# Patient Record
Sex: Male | Born: 1955 | Race: Black or African American | Hispanic: No | Marital: Married | Smoking: Never smoker
Health system: Southern US, Community
[De-identification: ages and names within clinical notes are randomized; demographics above are authoritative.]

## PROBLEM LIST (undated history)

## (undated) DIAGNOSIS — I82409 Acute embolism and thrombosis of unspecified deep veins of unspecified lower extremity: Secondary | ICD-10-CM

## (undated) DIAGNOSIS — F32A Depression, unspecified: Secondary | ICD-10-CM

## (undated) DIAGNOSIS — K219 Gastro-esophageal reflux disease without esophagitis: Secondary | ICD-10-CM

## (undated) DIAGNOSIS — R519 Headache, unspecified: Secondary | ICD-10-CM

## (undated) DIAGNOSIS — I10 Essential (primary) hypertension: Secondary | ICD-10-CM

## (undated) DIAGNOSIS — F419 Anxiety disorder, unspecified: Secondary | ICD-10-CM

## (undated) DIAGNOSIS — G4730 Sleep apnea, unspecified: Secondary | ICD-10-CM

## (undated) DIAGNOSIS — M1990 Unspecified osteoarthritis, unspecified site: Secondary | ICD-10-CM

## (undated) DIAGNOSIS — E785 Hyperlipidemia, unspecified: Secondary | ICD-10-CM

## (undated) DIAGNOSIS — N529 Male erectile dysfunction, unspecified: Secondary | ICD-10-CM

## (undated) HISTORY — PX: COLONOSCOPY: SHX174

## (undated) HISTORY — PX: TONSILLECTOMY: SUR1361

## (undated) HISTORY — PX: FRACTURE SURGERY: SHX138

## (undated) HISTORY — PX: BACK SURGERY: SHX140

---

## 2021-04-03 DIAGNOSIS — B029 Zoster without complications: Secondary | ICD-10-CM

## 2021-04-03 HISTORY — DX: Zoster without complications: B02.9

## 2021-04-05 ENCOUNTER — Other Ambulatory Visit: Payer: Self-pay | Admitting: Neurosurgery

## 2021-04-05 DIAGNOSIS — Z01818 Encounter for other preprocedural examination: Secondary | ICD-10-CM

## 2021-04-24 ENCOUNTER — Encounter
Admission: RE | Admit: 2021-04-24 | Discharge: 2021-04-24 | Disposition: A | Discharge status: Home / Self Care | Payer: Medicare Other | Source: Ambulatory Visit | Attending: Neurosurgery | Admitting: Neurosurgery

## 2021-04-24 ENCOUNTER — Other Ambulatory Visit: Payer: Self-pay

## 2021-04-24 DIAGNOSIS — Z01818 Encounter for other preprocedural examination: Secondary | ICD-10-CM | POA: Insufficient documentation

## 2021-04-24 DIAGNOSIS — I10 Essential (primary) hypertension: Secondary | ICD-10-CM | POA: Insufficient documentation

## 2021-04-24 HISTORY — DX: Anxiety disorder, unspecified: F41.9

## 2021-04-24 HISTORY — DX: Depression, unspecified: F32.A

## 2021-04-24 HISTORY — DX: Unspecified osteoarthritis, unspecified site: M19.90

## 2021-04-24 HISTORY — DX: Hyperlipidemia, unspecified: E78.5

## 2021-04-24 HISTORY — DX: Essential (primary) hypertension: I10

## 2021-04-24 HISTORY — DX: Male erectile dysfunction, unspecified: N52.9

## 2021-04-24 HISTORY — DX: Sleep apnea, unspecified: G47.30

## 2021-04-24 HISTORY — DX: Headache, unspecified: R51.9

## 2021-04-24 HISTORY — DX: Acute embolism and thrombosis of unspecified deep veins of unspecified lower extremity: I82.409

## 2021-04-24 HISTORY — DX: Gastro-esophageal reflux disease without esophagitis: K21.9

## 2021-04-24 LAB — URINALYSIS, ROUTINE W REFLEX MICROSCOPIC
Bilirubin Urine: NEGATIVE
Glucose, UA: NEGATIVE mg/dL
Hgb urine dipstick: NEGATIVE
Ketones, ur: NEGATIVE mg/dL
Leukocytes,Ua: NEGATIVE
Nitrite: NEGATIVE
Protein, ur: NEGATIVE mg/dL
Specific Gravity, Urine: 1.015 (ref 1.005–1.030)
pH: 8.5 — ABNORMAL HIGH (ref 5.0–8.0)

## 2021-04-24 LAB — BASIC METABOLIC PANEL
Anion gap: 10 (ref 5–15)
BUN: 14 mg/dL (ref 8–23)
CO2: 30 mmol/L (ref 22–32)
Calcium: 9.5 mg/dL (ref 8.9–10.3)
Chloride: 99 mmol/L (ref 98–111)
Creatinine, Ser: 0.92 mg/dL (ref 0.61–1.24)
GFR, Estimated: >60 mL/min (ref >60)
Glucose, Bld: 95 mg/dL (ref 70–99)
Potassium: 3.5 mmol/L (ref 3.5–5.1)
Sodium: 139 mmol/L (ref 135–145)

## 2021-04-24 LAB — CBC
HCT: 41.8 % (ref 39.0–52.0)
Hemoglobin: 13.2 g/dL (ref 13.0–17.0)
MCH: 27.6 pg (ref 26.0–34.0)
MCHC: 31.6 g/dL (ref 30.0–36.0)
MCV: 87.4 fL (ref 80.0–100.0)
Platelets: 285 10*3/uL (ref 150–400)
RBC: 4.78 MIL/uL (ref 4.22–5.81)
RDW: 13.8 % (ref 11.5–15.5)
WBC: 7.2 10*3/uL (ref 4.0–10.5)
nRBC: 0 % (ref 0.0–0.2)

## 2021-04-24 LAB — SURGICAL PCR SCREEN
MRSA, PCR: NEGATIVE
Staphylococcus aureus: NEGATIVE

## 2021-04-24 LAB — PROTIME-INR
INR: 1.1 (ref 0.8–1.2)
Prothrombin Time: 13.9 seconds (ref 11.4–15.2)

## 2021-04-24 LAB — TYPE AND SCREEN
ABO/RH(D): O POS
Antibody Screen: NEGATIVE

## 2021-04-24 LAB — APTT: aPTT: 28 seconds (ref 24–36)

## 2021-04-24 NOTE — Patient Instructions (Addendum)
Your procedure is scheduled on:05-08-21 Monday Report to the Registration Desk on the 1st floor of the [facility].Then proceed to the 2nd floor Surgery Desk in the [facility] To find out your arrival time, please call [phone] between 1PM - 3PM on:05-05-21 Friday  REMEMBER: Instructions that are not followed completely may result in serious medical risk, up to and including death; or upon the discretion of your surgeon and anesthesiologist your surgery may need to be rescheduled.  Do not eat food after midnight the night before surgery.  No gum chewing, lozengers or hard candies.  You may however, drink CLEAR liquids up to 2 hours before you are scheduled to arrive for your surgery. Do not drink anything within 2 hours of your scheduled arrival time.  Clear liquids include: - water  - apple juice without pulp - gatorade (not RED, PURPLE, OR BLUE) - black coffee or tea (Do NOT add milk or creamers to the coffee or tea) Do NOT drink anything that is not on this list  TAKE THESE MEDICATIONS THE MORNING OF SURGERY WITH A SIP OF WATER: -citalopram (CELEXA)  -gabapentin (NEURONTIN)  -methocarbamol (ROBAXIN)  -pantoprazole (PROTONIX )  Stop your apixaban (ELIQUIS) 3 days prior to surgery as instructed by Dr Julianne Handler dose on 05-04-21 Thursday Continue your aspirin EC 81 MG up until the day prior to surgery-Do NOT take the day of surgery  One week prior to surgery: Stop Anti-inflammatories (NSAIDS) such as Advil, Aleve, Ibuprofen, Motrin, Naproxen, Naprosyn and Aspirin based products such as Excedrin, Goodys Powder, BC Powder.You may however, continue to take Tylenol/Tramadol/Oxycodone if needed for pain up until the day of surgery.  Stop ANY OVER THE COUNTER supplements/vitamins 7 days prior to surgery (Echinacea, Multiple Vitamins, Omega-3 Fatty Acids (FISH OIL)-You may continue your Melatonin up until the day prior to surgery  No Alcohol for 24 hours before or after  surgery.  No Smoking including e-cigarettes for 24 hours prior to surgery.  No chewable tobacco products for at least 6 hours prior to surgery.  No nicotine patches on the day of surgery.  Do not use any "recreational" drugs for at least a week prior to your surgery.  Please be advised that the combination of cocaine and anesthesia may have negative outcomes, up to and including death. If you test positive for cocaine, your surgery will be cancelled.  On the morning of surgery brush your teeth with toothpaste and water, you may rinse your mouth with mouthwash if you wish. Do not swallow any toothpaste or mouthwash.  Use CHG Soap as directed on instruction sheet.  Bring your C-pap machine to the hospital  Do not wear jewelry, make-up, hairpins, clips or nail polish.  Do not wear lotions, powders, or perfumes.   Do not shave body from the neck down 48 hours prior to surgery just in case you cut yourself which could leave a site for infection.  Also, freshly shaved skin may become irritated if using the CHG soap.  Contact lenses, hearing aids and dentures may not be worn into surgery.  Do not bring valuables to the hospital. [facility] is not responsible for any missing/lost belongings or valuables.   Notify your doctor if there is any change in your medical condition (cold, fever, infection).  Wear comfortable clothing (specific to your surgery type) to the hospital.  After surgery, you can help prevent lung complications by doing breathing exercises.  Take deep breaths and cough every 1-2 hours. Your doctor may order  a device called an Incentive Spirometer to help you take deep breaths. When coughing or sneezing, hold a pillow firmly against your incision with both hands. This is called splinting. Doing this helps protect your incision. It also decreases belly discomfort.  If you are being admitted to the hospital overnight, leave your suitcase in the car. After surgery it may  be brought to your room.  If you are being discharged the day of surgery, you will not be allowed to drive home. You will need a responsible adult (18 years or older) to drive you home and stay with you that night.   If you are taking public transportation, you will need to have a responsible adult (18 years or older) with you. Please confirm with your physician that it is acceptable to use public transportation.   Please call the [place] Dept. at [phone] if you have any questions about these instructions.  Surgery Visitation Policy:  Patients undergoing a surgery or procedure may have one family member or support person with them as long as that person is not COVID-19 positive or experiencing its symptoms.  That person may remain in the waiting area during the procedure and may rotate out with other people.  Inpatient Visitation:    Visiting hours are 7 a.m. to 8 p.m. Up to two visitors ages 16+ are allowed at one time in a patient room. The visitors may rotate out with other people during the day. Visitors must check out when they leave, or other visitors will not be allowed. One designated support person may remain overnight. The visitor must pass COVID-19 screenings, use hand sanitizer when entering and exiting the patients room and wear a mask at all times, including in the patients room. Patients must also wear a mask when staff or their visitor are in the room. Masking is required regardless of vaccination status.

## 2021-04-24 NOTE — Patient Instructions (Signed)
Your procedure is scheduled on:05-08-21 Friday Report to the Registration Desk on the 1st floor of the [facility].Then proceed to the 2nd floor Surgery Desk in the [facility] To find out your arrival time, please call [phone] between 1PM - 3PM on:05-07-21 Thursday  REMEMBER: Instructions that are not followed completely may result in serious medical risk, up to and including death; or upon the discretion of your surgeon and anesthesiologist your surgery may need to be rescheduled.  Do not eat food after midnight the night before surgery.  No gum chewing, lozengers or hard candies.  You may however, drink CLEAR liquids up to 2 hours before you are scheduled to arrive for your surgery. Do not drink anything within 2 hours of your scheduled arrival time.  Clear liquids include: - water  - apple juice without pulp - gatorade (not RED, PURPLE, OR BLUE) - black coffee or tea (Do NOT add milk or creamers to the coffee or tea) Do NOT drink anything that is not on this list.  Type 1 and Type 2 diabetics should only drink water.  In addition, your doctor has ordered for you to drink the provided  Ensure Pre-Surgery Clear Carbohydrate Drink  Gatorade G2 Drinking this carbohydrate drink up to two hours before surgery helps to reduce insulin resistance and improve patient outcomes. Please complete drinking 2 hours prior to scheduled arrival time.  TAKE THESE MEDICATIONS THE MORNING OF SURGERY WITH A SIP OF WATER:  (take one the night before and one on the morning of surgery - helps to prevent nausea after surgery.)  Use inhalers on the day of surgery and bring to the hospital.  **Follow new guidelines for insulin and diabetes medications.**  Follow recommendations from Cardiologist, Pulmonologist or PCP regarding stopping Aspirin, Coumadin, Plavix, Eliquis, Pradaxa, or Pletal.  One week prior to surgery: Stop Anti-inflammatories (NSAIDS) such as Advil, Aleve, Ibuprofen, Motrin,  Naproxen, Naprosyn and Aspirin based products such as Excedrin, Goodys Powder, BC Powder. Stop ANY OVER THE COUNTER supplements until after surgery. You may however, continue to take Tylenol if needed for pain up until the day of surgery.  No Alcohol for 24 hours before or after surgery.  No Smoking including e-cigarettes for 24 hours prior to surgery.  No chewable tobacco products for at least 6 hours prior to surgery.  No nicotine patches on the day of surgery.  Do not use any "recreational" drugs for at least a week prior to your surgery.  Please be advised that the combination of cocaine and anesthesia may have negative outcomes, up to and including death. If you test positive for cocaine, your surgery will be cancelled.  On the morning of surgery brush your teeth with toothpaste and water, you may rinse your mouth with mouthwash if you wish. Do not swallow any toothpaste or mouthwash.  Use CHG Soap or wipes as directed on instruction sheet.  Do not wear jewelry, make-up, hairpins, clips or nail polish.  Do not wear lotions, powders, or perfumes.   Do not shave body from the neck down 48 hours prior to surgery just in case you cut yourself which could leave a site for infection.  Also, freshly shaved skin may become irritated if using the CHG soap.  Contact lenses, hearing aids and dentures may not be worn into surgery.  Do not bring valuables to the hospital. [facility] is not responsible for any missing/lost belongings or valuables.   Total Shoulder Arthroplasty:  use Benzolyl Peroxide 5% Gel  as directed on instruction sheet.  Fleets enema or bowel prep as directed.  Bring your C-PAP to the hospital with you in case you may have to spend the night.   Notify your doctor if there is any change in your medical condition (cold, fever, infection).  Wear comfortable clothing (specific to your surgery type) to the hospital.  After surgery, you can help prevent lung  complications by doing breathing exercises.  Take deep breaths and cough every 1-2 hours. Your doctor may order a device called an Incentive Spirometer to help you take deep breaths. When coughing or sneezing, hold a pillow firmly against your incision with both hands. This is called splinting. Doing this helps protect your incision. It also decreases belly discomfort.  If you are being admitted to the hospital overnight, leave your suitcase in the car. After surgery it may be brought to your room.  If you are being discharged the day of surgery, you will not be allowed to drive home. You will need a responsible adult (18 years or older) to drive you home and stay with you that night.   If you are taking public transportation, you will need to have a responsible adult (18 years or older) with you. Please confirm with your physician that it is acceptable to use public transportation.   Please call the [facility]missions [facility] Dept. at [phone] if you have any questions about these instructions.  Surgery Visitation Policy:  Patients undergoing a surgery or procedure may have one family member or support person with them as long as that person is not COVID-19 positive or experiencing its symptoms.  That person may remain in the waiting area during the procedure and may rotate out with other people.  Inpatient Visitation:    Visiting hours are 7 a.m. to 8 p.m. Up to two visitors ages 16+ are allowed at one time in a patient room. The visitors may rotate out with other people during the day. Visitors must check out when they leave, or other visitors will not be allowed. One designated support person may remain overnight. The visitor must pass COVID-19 screenings, use hand sanitizer when entering and exiting the patients room and wear a mask at all times, including in the patients room. Patients must also wear a mask when staff or their visitor are in the room. Masking is required  regardless of vaccination status.

## 2021-05-05 ENCOUNTER — Other Ambulatory Visit: Payer: Self-pay

## 2021-05-05 ENCOUNTER — Other Ambulatory Visit
Admission: RE | Admit: 2021-05-05 | Discharge: 2021-05-05 | Disposition: A | Discharge status: Home / Self Care | Payer: Medicare Other | Source: Ambulatory Visit | Attending: Neurosurgery | Admitting: Neurosurgery

## 2021-05-05 DIAGNOSIS — Z20822 Contact with and (suspected) exposure to covid-19: Secondary | ICD-10-CM | POA: Insufficient documentation

## 2021-05-05 DIAGNOSIS — Z01812 Encounter for preprocedural laboratory examination: Secondary | ICD-10-CM | POA: Insufficient documentation

## 2021-05-06 LAB — SARS CORONAVIRUS 2 (TAT 6-24 HRS): SARS Coronavirus 2: NEGATIVE

## 2021-05-08 ENCOUNTER — Inpatient Hospital Stay: Payer: Medicare Other

## 2021-05-08 ENCOUNTER — Other Ambulatory Visit: Payer: Self-pay

## 2021-05-08 ENCOUNTER — Inpatient Hospital Stay
Admission: RE | Admit: 2021-05-08 | Discharge: 2021-05-13 | DRG: 460 | Disposition: A | Discharge status: Home Health | Payer: Medicare Other | Attending: Neurosurgery | Admitting: Neurosurgery

## 2021-05-08 ENCOUNTER — Inpatient Hospital Stay: Payer: Medicare Other | Admitting: Certified Registered Nurse Anesthetist

## 2021-05-08 ENCOUNTER — Encounter: Payer: Self-pay | Admitting: Neurosurgery

## 2021-05-08 ENCOUNTER — Encounter
Admission: RE | Disposition: A | Discharge status: Home Health | Payer: Self-pay | Source: Home / Self Care | Attending: Neurosurgery

## 2021-05-08 ENCOUNTER — Inpatient Hospital Stay: Payer: Medicare Other | Admitting: Urgent Care

## 2021-05-08 DIAGNOSIS — S3210XA Unspecified fracture of sacrum, initial encounter for closed fracture: Secondary | ICD-10-CM | POA: Diagnosis present

## 2021-05-08 DIAGNOSIS — E785 Hyperlipidemia, unspecified: Secondary | ICD-10-CM | POA: Diagnosis present

## 2021-05-08 DIAGNOSIS — Z833 Family history of diabetes mellitus: Secondary | ICD-10-CM | POA: Diagnosis not present

## 2021-05-08 DIAGNOSIS — K219 Gastro-esophageal reflux disease without esophagitis: Secondary | ICD-10-CM | POA: Diagnosis present

## 2021-05-08 DIAGNOSIS — M5416 Radiculopathy, lumbar region: Secondary | ICD-10-CM | POA: Diagnosis present

## 2021-05-08 DIAGNOSIS — R0602 Shortness of breath: Secondary | ICD-10-CM | POA: Diagnosis not present

## 2021-05-08 DIAGNOSIS — S32028A Other fracture of second lumbar vertebra, initial encounter for closed fracture: Secondary | ICD-10-CM | POA: Diagnosis present

## 2021-05-08 DIAGNOSIS — R071 Chest pain on breathing: Secondary | ICD-10-CM | POA: Diagnosis not present

## 2021-05-08 DIAGNOSIS — Z820 Family history of epilepsy and other diseases of the nervous system: Secondary | ICD-10-CM | POA: Diagnosis not present

## 2021-05-08 DIAGNOSIS — M438X9 Other specified deforming dorsopathies, site unspecified: Secondary | ICD-10-CM

## 2021-05-08 DIAGNOSIS — M960 Pseudarthrosis after fusion or arthrodesis: Secondary | ICD-10-CM | POA: Diagnosis present

## 2021-05-08 DIAGNOSIS — Y838 Other surgical procedures as the cause of abnormal reaction of the patient, or of later complication, without mention of misadventure at the time of the procedure: Secondary | ICD-10-CM | POA: Diagnosis present

## 2021-05-08 DIAGNOSIS — Z8042 Family history of malignant neoplasm of prostate: Secondary | ICD-10-CM

## 2021-05-08 DIAGNOSIS — R519 Headache, unspecified: Secondary | ICD-10-CM | POA: Diagnosis not present

## 2021-05-08 DIAGNOSIS — Z825 Family history of asthma and other chronic lower respiratory diseases: Secondary | ICD-10-CM | POA: Diagnosis not present

## 2021-05-08 DIAGNOSIS — G4733 Obstructive sleep apnea (adult) (pediatric): Secondary | ICD-10-CM | POA: Diagnosis present

## 2021-05-08 DIAGNOSIS — F419 Anxiety disorder, unspecified: Secondary | ICD-10-CM | POA: Diagnosis present

## 2021-05-08 DIAGNOSIS — M4316 Spondylolisthesis, lumbar region: Secondary | ICD-10-CM | POA: Diagnosis present

## 2021-05-08 DIAGNOSIS — Z20822 Contact with and (suspected) exposure to covid-19: Secondary | ICD-10-CM | POA: Diagnosis present

## 2021-05-08 DIAGNOSIS — Z8249 Family history of ischemic heart disease and other diseases of the circulatory system: Secondary | ICD-10-CM

## 2021-05-08 DIAGNOSIS — M4317 Spondylolisthesis, lumbosacral region: Secondary | ICD-10-CM | POA: Diagnosis present

## 2021-05-08 DIAGNOSIS — Z1152 Encounter for screening for COVID-19: Secondary | ICD-10-CM

## 2021-05-08 DIAGNOSIS — K5900 Constipation, unspecified: Secondary | ICD-10-CM | POA: Diagnosis not present

## 2021-05-08 DIAGNOSIS — I10 Essential (primary) hypertension: Secondary | ICD-10-CM | POA: Diagnosis present

## 2021-05-08 DIAGNOSIS — X58XXXA Exposure to other specified factors, initial encounter: Secondary | ICD-10-CM | POA: Diagnosis present

## 2021-05-08 DIAGNOSIS — F32A Depression, unspecified: Secondary | ICD-10-CM | POA: Diagnosis present

## 2021-05-08 DIAGNOSIS — G35 Multiple sclerosis: Secondary | ICD-10-CM | POA: Diagnosis present

## 2021-05-08 DIAGNOSIS — Z800 Family history of malignant neoplasm of digestive organs: Secondary | ICD-10-CM | POA: Diagnosis not present

## 2021-05-08 HISTORY — PX: APPLICATION OF WOUND VAC: SHX5189

## 2021-05-08 HISTORY — PX: APPLICATION OF INTRAOPERATIVE CT SCAN: SHX6668

## 2021-05-08 HISTORY — PX: LUMBAR LAMINECTOMY/DECOMPRESSION MICRODISCECTOMY: SHX5026

## 2021-05-08 HISTORY — PX: POSTERIOR LUMBAR FUSION 4 WITH HARDWARE REMOVAL: SHX6038

## 2021-05-08 LAB — ABO/RH: ABO/RH(D): O POS

## 2021-05-08 SURGERY — LUMBAR LAMINECTOMY/DECOMPRESSION MICRODISCECTOMY 2 LEVELS
Anesthesia: General | Site: Spine Lumbar

## 2021-05-08 MED ORDER — FLEET ENEMA 7-19 GM/118ML RE ENEM
1.0000 | ENEMA | Freq: Once | RECTAL | Status: AC | PRN
  Administered 2021-05-12: 1 via RECTAL

## 2021-05-08 MED ORDER — PROPOFOL 10 MG/ML IV BOLUS
INTRAVENOUS | Status: DC | PRN
  Administered 2021-05-08: 140 mg via INTRAVENOUS
  Administered 2021-05-08: 60 mg via INTRAVENOUS

## 2021-05-08 MED ORDER — FENTANYL CITRATE (PF) 100 MCG/2ML IJ SOLN
INTRAMUSCULAR | Status: AC
  Filled 2021-05-08: qty 2

## 2021-05-08 MED ORDER — MIDAZOLAM HCL 2 MG/2ML IJ SOLN
INTRAMUSCULAR | Status: DC | PRN
  Administered 2021-05-08: 2 mg via INTRAVENOUS

## 2021-05-08 MED ORDER — KETOROLAC TROMETHAMINE 15 MG/ML IJ SOLN
7.5000 mg | Freq: Four times a day (QID) | INTRAMUSCULAR | Status: AC
  Administered 2021-05-08 – 2021-05-09 (×4): 7.5 mg via INTRAVENOUS
  Filled 2021-05-08 (×4): qty 1

## 2021-05-08 MED ORDER — CEFAZOLIN SODIUM-DEXTROSE 2-4 GM/100ML-% IV SOLN
2.0000 g | Freq: Once | INTRAVENOUS | Status: AC
  Administered 2021-05-08: 2 g via INTRAVENOUS

## 2021-05-08 MED ORDER — FENTANYL CITRATE (PF) 100 MCG/2ML IJ SOLN
INTRAMUSCULAR | Status: DC | PRN
  Administered 2021-05-08 (×4): 50 ug via INTRAVENOUS

## 2021-05-08 MED ORDER — DONEPEZIL HCL 5 MG PO TABS
10.0000 mg | ORAL_TABLET | Freq: Every day | ORAL | Status: DC
  Administered 2021-05-08 – 2021-05-12 (×5): 10 mg via ORAL
  Filled 2021-05-08 (×5): qty 2

## 2021-05-08 MED ORDER — SODIUM CHLORIDE FLUSH 0.9 % IV SOLN
INTRAVENOUS | Status: AC
  Filled 2021-05-08: qty 20

## 2021-05-08 MED ORDER — VANCOMYCIN HCL 1000 MG IV SOLR
INTRAVENOUS | Status: AC
  Filled 2021-05-08: qty 20

## 2021-05-08 MED ORDER — SODIUM CHLORIDE (PF) 0.9 % IJ SOLN
INTRAMUSCULAR | Status: DC | PRN
  Administered 2021-05-08: 60 mL via INTRAMUSCULAR

## 2021-05-08 MED ORDER — MIDAZOLAM HCL 2 MG/2ML IJ SOLN
INTRAMUSCULAR | Status: AC
  Filled 2021-05-08: qty 2

## 2021-05-08 MED ORDER — SENNA 8.6 MG PO TABS
1.0000 | ORAL_TABLET | Freq: Two times a day (BID) | ORAL | Status: DC
  Administered 2021-05-08 – 2021-05-12 (×9): 8.6 mg via ORAL
  Filled 2021-05-08 (×10): qty 1

## 2021-05-08 MED ORDER — VANCOMYCIN HCL IN DEXTROSE 1-5 GM/200ML-% IV SOLN: 1000.0000 mg | Freq: Once | INTRAVENOUS | Status: AC

## 2021-05-08 MED ORDER — DEXMEDETOMIDINE (PRECEDEX) IN NS 20 MCG/5ML (4 MCG/ML) IV SYRINGE
PREFILLED_SYRINGE | INTRAVENOUS | Status: DC | PRN
  Administered 2021-05-08: 4 ug via INTRAVENOUS

## 2021-05-08 MED ORDER — GABAPENTIN 300 MG PO CAPS
300.0000 mg | ORAL_CAPSULE | Freq: Four times a day (QID) | ORAL | Status: DC
  Administered 2021-05-08 – 2021-05-13 (×20): 300 mg via ORAL
  Filled 2021-05-08 (×20): qty 1

## 2021-05-08 MED ORDER — OXYCODONE HCL 5 MG PO TABS
5.0000 mg | ORAL_TABLET | ORAL | Status: DC | PRN
  Filled 2021-05-08 (×2): qty 1

## 2021-05-08 MED ORDER — OXYCODONE HCL 5 MG PO TABS
10.0000 mg | ORAL_TABLET | ORAL | Status: DC | PRN
  Administered 2021-05-09 – 2021-05-13 (×23): 10 mg via ORAL
  Filled 2021-05-08 (×22): qty 2

## 2021-05-08 MED ORDER — KETAMINE HCL 10 MG/ML IJ SOLN
INTRAMUSCULAR | Status: DC | PRN
  Administered 2021-05-08: 10 mg via INTRAVENOUS
  Administered 2021-05-08: 20 mg via INTRAVENOUS
  Administered 2021-05-08 (×2): 10 mg via INTRAVENOUS

## 2021-05-08 MED ORDER — KETAMINE HCL 50 MG/5ML IJ SOSY
PREFILLED_SYRINGE | INTRAMUSCULAR | Status: AC
  Filled 2021-05-08: qty 5

## 2021-05-08 MED ORDER — BUPIVACAINE LIPOSOME 1.3 % IJ SUSP
INTRAMUSCULAR | Status: AC
  Filled 2021-05-08: qty 20

## 2021-05-08 MED ORDER — PHENOL 1.4 % MT LIQD
1.0000 | OROMUCOSAL | Status: DC | PRN
  Filled 2021-05-08: qty 177

## 2021-05-08 MED ORDER — BUPIVACAINE-EPINEPHRINE (PF) 0.5% -1:200000 IJ SOLN
INTRAMUSCULAR | Status: DC | PRN
  Administered 2021-05-08: 10 mL

## 2021-05-08 MED ORDER — HYDROMORPHONE HCL 1 MG/ML IJ SOLN
1.0000 mg | INTRAMUSCULAR | Status: DC | PRN
  Administered 2021-05-08 – 2021-05-10 (×5): 1 mg via INTRAVENOUS
  Filled 2021-05-08 (×5): qty 1

## 2021-05-08 MED ORDER — HYDROCHLOROTHIAZIDE 25 MG PO TABS
25.0000 mg | ORAL_TABLET | ORAL | Status: DC
  Administered 2021-05-09 – 2021-05-13 (×5): 25 mg via ORAL
  Filled 2021-05-08 (×5): qty 1

## 2021-05-08 MED ORDER — ONDANSETRON HCL 4 MG/2ML IJ SOLN: 4.0000 mg | Freq: Four times a day (QID) | INTRAMUSCULAR | Status: DC | PRN

## 2021-05-08 MED ORDER — PHENYLEPHRINE HCL-NACL 20-0.9 MG/250ML-% IV SOLN
INTRAVENOUS | Status: AC
  Filled 2021-05-08: qty 250

## 2021-05-08 MED ORDER — ONDANSETRON HCL 4 MG/2ML IJ SOLN: 4.0000 mg | Freq: Once | INTRAMUSCULAR | Status: DC | PRN

## 2021-05-08 MED ORDER — ENOXAPARIN SODIUM 40 MG/0.4ML IJ SOSY
40.0000 mg | PREFILLED_SYRINGE | Freq: Every day | INTRAMUSCULAR | Status: DC
  Administered 2021-05-09 – 2021-05-13 (×5): 40 mg via SUBCUTANEOUS
  Filled 2021-05-08 (×5): qty 0.4

## 2021-05-08 MED ORDER — PRONTOSAN WOUND IRRIGATION OPTIME
TOPICAL | Status: DC | PRN
Start: 2021-05-08 — End: 2021-05-08
  Administered 2021-05-08: 1

## 2021-05-08 MED ORDER — CITALOPRAM HYDROBROMIDE 20 MG PO TABS
20.0000 mg | ORAL_TABLET | ORAL | Status: DC
  Administered 2021-05-09 – 2021-05-13 (×5): 20 mg via ORAL
  Filled 2021-05-08 (×5): qty 1

## 2021-05-08 MED ORDER — REMIFENTANIL HCL 1 MG IV SOLR
INTRAVENOUS | Status: DC | PRN
  Administered 2021-05-08: .05 ug/kg/min via INTRAVENOUS

## 2021-05-08 MED ORDER — SODIUM CHLORIDE 0.9% FLUSH: 3.0000 mL | INTRAVENOUS | Status: DC | PRN

## 2021-05-08 MED ORDER — PROPOFOL 10 MG/ML IV BOLUS
INTRAVENOUS | Status: AC
  Filled 2021-05-08: qty 40

## 2021-05-08 MED ORDER — EPHEDRINE SULFATE (PRESSORS) 50 MG/ML IJ SOLN
INTRAMUSCULAR | Status: DC | PRN
  Administered 2021-05-08 (×5): 5 mg via INTRAVENOUS

## 2021-05-08 MED ORDER — CHLORHEXIDINE GLUCONATE 0.12 % MT SOLN
OROMUCOSAL | Status: AC
  Administered 2021-05-08: 15 mL via OROMUCOSAL
  Filled 2021-05-08: qty 15

## 2021-05-08 MED ORDER — PRAVASTATIN SODIUM 20 MG PO TABS
20.0000 mg | ORAL_TABLET | Freq: Every day | ORAL | Status: DC
Start: 2021-05-08 — End: 2021-05-13
  Administered 2021-05-08 – 2021-05-12 (×5): 20 mg via ORAL
  Filled 2021-05-08 (×5): qty 1

## 2021-05-08 MED ORDER — DEXAMETHASONE SODIUM PHOSPHATE 10 MG/ML IJ SOLN
INTRAMUSCULAR | Status: AC
  Filled 2021-05-08: qty 1

## 2021-05-08 MED ORDER — LIDOCAINE HCL (PF) 2 % IJ SOLN
INTRAMUSCULAR | Status: AC
  Filled 2021-05-08: qty 5

## 2021-05-08 MED ORDER — BUPIVACAINE-EPINEPHRINE (PF) 0.5% -1:200000 IJ SOLN
INTRAMUSCULAR | Status: AC
  Filled 2021-05-08: qty 30

## 2021-05-08 MED ORDER — VANCOMYCIN HCL 1000 MG IV SOLR
INTRAVENOUS | Status: DC | PRN
  Administered 2021-05-08: 1000 mg via TOPICAL

## 2021-05-08 MED ORDER — CHLORHEXIDINE GLUCONATE 0.12 % MT SOLN: 15.0000 mL | Freq: Once | OROMUCOSAL | Status: AC

## 2021-05-08 MED ORDER — EPHEDRINE 5 MG/ML INJ
INTRAVENOUS | Status: AC
  Filled 2021-05-08: qty 5

## 2021-05-08 MED ORDER — FENTANYL CITRATE (PF) 100 MCG/2ML IJ SOLN
INTRAMUSCULAR | Status: AC
  Administered 2021-05-08: 25 ug via INTRAVENOUS
  Filled 2021-05-08: qty 2

## 2021-05-08 MED ORDER — ACETAMINOPHEN 10 MG/ML IV SOLN
INTRAVENOUS | Status: DC | PRN
  Administered 2021-05-08: 1000 mg via INTRAVENOUS

## 2021-05-08 MED ORDER — GLYCOPYRROLATE 0.2 MG/ML IJ SOLN
INTRAMUSCULAR | Status: DC | PRN
  Administered 2021-05-08: .2 mg via INTRAVENOUS

## 2021-05-08 MED ORDER — MENTHOL 3 MG MT LOZG
1.0000 | LOZENGE | OROMUCOSAL | Status: DC | PRN
  Administered 2021-05-09 – 2021-05-10 (×2): 3 mg via ORAL
  Filled 2021-05-08 (×2): qty 9

## 2021-05-08 MED ORDER — GLYCOPYRROLATE 0.2 MG/ML IJ SOLN
INTRAMUSCULAR | Status: AC
  Filled 2021-05-08: qty 1

## 2021-05-08 MED ORDER — ONDANSETRON HCL 4 MG PO TABS: 4.0000 mg | ORAL_TABLET | Freq: Four times a day (QID) | ORAL | Status: DC | PRN

## 2021-05-08 MED ORDER — ACETAMINOPHEN 10 MG/ML IV SOLN
INTRAVENOUS | Status: AC
  Filled 2021-05-08: qty 100

## 2021-05-08 MED ORDER — 0.9 % SODIUM CHLORIDE (POUR BTL) OPTIME
TOPICAL | Status: DC | PRN
  Administered 2021-05-08: 1000 mL

## 2021-05-08 MED ORDER — ZOLPIDEM TARTRATE 5 MG PO TABS
5.0000 mg | ORAL_TABLET | Freq: Every evening | ORAL | Status: DC | PRN
  Administered 2021-05-08 – 2021-05-12 (×5): 5 mg via ORAL
  Filled 2021-05-08 (×5): qty 1

## 2021-05-08 MED ORDER — FENTANYL CITRATE (PF) 100 MCG/2ML IJ SOLN
25.0000 ug | INTRAMUSCULAR | Status: AC | PRN
  Administered 2021-05-08 (×4): 25 ug via INTRAVENOUS

## 2021-05-08 MED ORDER — ROCURONIUM BROMIDE 10 MG/ML (PF) SYRINGE
PREFILLED_SYRINGE | INTRAVENOUS | Status: AC
  Filled 2021-05-08: qty 10

## 2021-05-08 MED ORDER — ONDANSETRON HCL 4 MG/2ML IJ SOLN
INTRAMUSCULAR | Status: DC | PRN
Start: 2021-05-08 — End: 2021-05-08
  Administered 2021-05-08: 4 mg via INTRAVENOUS

## 2021-05-08 MED ORDER — POLYETHYLENE GLYCOL 3350 17 G PO PACK
17.0000 g | PACK | Freq: Every day | ORAL | Status: DC | PRN
  Administered 2021-05-09 – 2021-05-12 (×3): 17 g via ORAL
  Filled 2021-05-08 (×3): qty 1

## 2021-05-08 MED ORDER — METHOCARBAMOL 1000 MG/10ML IJ SOLN
500.0000 mg | Freq: Four times a day (QID) | INTRAVENOUS | Status: DC | PRN
  Administered 2021-05-08: 500 mg via INTRAVENOUS
  Filled 2021-05-08 (×2): qty 5

## 2021-05-08 MED ORDER — SUCCINYLCHOLINE CHLORIDE 200 MG/10ML IV SOSY
PREFILLED_SYRINGE | INTRAVENOUS | Status: DC | PRN
  Administered 2021-05-08: 100 mg via INTRAVENOUS

## 2021-05-08 MED ORDER — METHOCARBAMOL 500 MG PO TABS
500.0000 mg | ORAL_TABLET | Freq: Four times a day (QID) | ORAL | Status: DC | PRN
  Administered 2021-05-10 – 2021-05-13 (×6): 500 mg via ORAL
  Filled 2021-05-08 (×7): qty 1

## 2021-05-08 MED ORDER — SODIUM CHLORIDE 0.9 % IV SOLN: 250.0000 mL | INTRAVENOUS | Status: DC

## 2021-05-08 MED ORDER — ORAL CARE MOUTH RINSE: 15.0000 mL | Freq: Once | OROMUCOSAL | Status: AC

## 2021-05-08 MED ORDER — VANCOMYCIN HCL IN DEXTROSE 1-5 GM/200ML-% IV SOLN
INTRAVENOUS | Status: AC
  Administered 2021-05-08: 1000 mg via INTRAVENOUS
  Filled 2021-05-08: qty 200

## 2021-05-08 MED ORDER — BUPIVACAINE HCL (PF) 0.5 % IJ SOLN
INTRAMUSCULAR | Status: AC
  Filled 2021-05-08: qty 30

## 2021-05-08 MED ORDER — BISACODYL 10 MG RE SUPP: 10.0000 mg | Freq: Every day | RECTAL | Status: DC | PRN

## 2021-05-08 MED ORDER — CEFAZOLIN SODIUM-DEXTROSE 2-4 GM/100ML-% IV SOLN
INTRAVENOUS | Status: AC
  Filled 2021-05-08: qty 100

## 2021-05-08 MED ORDER — REMIFENTANIL HCL 1 MG IV SOLR
INTRAVENOUS | Status: AC
  Filled 2021-05-08: qty 1000

## 2021-05-08 MED ORDER — PHENYLEPHRINE HCL-NACL 20-0.9 MG/250ML-% IV SOLN
INTRAVENOUS | Status: DC | PRN
  Administered 2021-05-08: 30 ug/min via INTRAVENOUS

## 2021-05-08 MED ORDER — SEVOFLURANE IN SOLN
RESPIRATORY_TRACT | Status: AC
  Filled 2021-05-08: qty 250

## 2021-05-08 MED ORDER — SURGIFLO WITH THROMBIN (HEMOSTATIC MATRIX KIT) OPTIME
TOPICAL | Status: DC | PRN
  Administered 2021-05-08 (×2): 1 via TOPICAL

## 2021-05-08 MED ORDER — ACETAMINOPHEN 500 MG PO TABS
1000.0000 mg | ORAL_TABLET | Freq: Four times a day (QID) | ORAL | Status: AC
  Administered 2021-05-08 – 2021-05-09 (×3): 1000 mg via ORAL
  Filled 2021-05-08 (×3): qty 2

## 2021-05-08 MED ORDER — ONDANSETRON HCL 4 MG/2ML IJ SOLN
INTRAMUSCULAR | Status: AC
  Filled 2021-05-08: qty 2

## 2021-05-08 MED ORDER — SODIUM CHLORIDE 0.9 % IV SOLN: INTRAVENOUS | Status: DC

## 2021-05-08 MED ORDER — PANTOPRAZOLE SODIUM 40 MG PO TBEC
40.0000 mg | DELAYED_RELEASE_TABLET | Freq: Every day | ORAL | Status: DC
  Administered 2021-05-08 – 2021-05-12 (×5): 40 mg via ORAL
  Filled 2021-05-08 (×5): qty 1

## 2021-05-08 MED ORDER — DEXAMETHASONE SODIUM PHOSPHATE 10 MG/ML IJ SOLN
INTRAMUSCULAR | Status: DC | PRN
  Administered 2021-05-08: 10 mg via INTRAVENOUS

## 2021-05-08 MED ORDER — LIDOCAINE HCL (CARDIAC) PF 100 MG/5ML IV SOSY
PREFILLED_SYRINGE | INTRAVENOUS | Status: DC | PRN
  Administered 2021-05-08: 100 mg via INTRAVENOUS

## 2021-05-08 MED ORDER — LACTATED RINGERS IV SOLN: INTRAVENOUS | Status: DC

## 2021-05-08 MED ORDER — DEXMEDETOMIDINE (PRECEDEX) IN NS 20 MCG/5ML (4 MCG/ML) IV SYRINGE
PREFILLED_SYRINGE | INTRAVENOUS | Status: AC
  Filled 2021-05-08: qty 5

## 2021-05-08 MED ORDER — MELATONIN 5 MG PO TABS: 5.0000 mg | ORAL_TABLET | Freq: Every evening | ORAL | Status: DC | PRN

## 2021-05-08 MED ORDER — SODIUM CHLORIDE 0.9% FLUSH
3.0000 mL | Freq: Two times a day (BID) | INTRAVENOUS | Status: DC
  Administered 2021-05-08 – 2021-05-13 (×9): 3 mL via INTRAVENOUS

## 2021-05-08 SURGICAL SUPPLY — 78 items
BLADE BOVIE TIP EXT 4 (BLADE) ×1 IMPLANT
BONE CANC CHIPS 40CC CAN1/2 (Bone Implant) ×3 IMPLANT
BUR NEURO DRILL SOFT 3.0X3.8M (BURR) ×3 IMPLANT
CAP LOCKING THREADED (Cap) ×14 IMPLANT
CHIPS CANC BONE 40CC CAN1/2 (Bone Implant) ×2 IMPLANT
CHLORAPREP W/TINT 26 (MISCELLANEOUS) ×6 IMPLANT
CNTNR SPEC 2.5X3XGRAD LEK (MISCELLANEOUS) ×2
CONNECTOR OFFSET 15 (Connector) ×2 IMPLANT
CONT SPEC 4OZ STER OR WHT (MISCELLANEOUS) ×1
CONTAINER SPEC 2.5X3XGRAD LEK (MISCELLANEOUS) ×2 IMPLANT
COUNTER NEEDLE 20/40 LG (NEEDLE) ×3 IMPLANT
CUP MEDICINE 2OZ PLAST GRAD ST (MISCELLANEOUS) ×3 IMPLANT
DERMABOND ADVANCED (GAUZE/BANDAGES/DRESSINGS) ×1
DERMABOND ADVANCED .7 DNX12 (GAUZE/BANDAGES/DRESSINGS) ×2 IMPLANT
DRAPE 3D C-ARM OEC (DRAPES) IMPLANT
DRAPE C-ARMOR (DRAPES) IMPLANT
DRAPE LAPAROTOMY 100X77 ABD (DRAPES) ×3 IMPLANT
DRAPE SCAN PATIENT (DRAPES) ×3 IMPLANT
DRAPE SURG 17X11 SM STRL (DRAPES) ×9 IMPLANT
DRESSING PEEL AND PLAC PRVNA20 (GAUZE/BANDAGES/DRESSINGS) IMPLANT
DRSG PEEL AND PLACE PREVENA 20 (GAUZE/BANDAGES/DRESSINGS) ×3
ELECT CAUTERY BLADE TIP 2.5 (TIP) ×3
ELECT EZSTD 165MM 6.5IN (MISCELLANEOUS) ×3
ELECT REM PT RETURN 9FT ADLT (ELECTROSURGICAL) ×3
ELECTRODE CAUTERY BLDE TIP 2.5 (TIP) ×2 IMPLANT
ELECTRODE EZSTD 165MM 6.5IN (MISCELLANEOUS) ×2 IMPLANT
ELECTRODE REM PT RTRN 9FT ADLT (ELECTROSURGICAL) ×2 IMPLANT
GAUZE 4X4 16PLY ~~LOC~~+RFID DBL (SPONGE) ×3 IMPLANT
GLOVE SURG SYN 6.5 ES PF (GLOVE) ×6 IMPLANT
GLOVE SURG SYN 6.5 PF PI (GLOVE) ×4 IMPLANT
GLOVE SURG SYN 8.5  E (GLOVE) ×3
GLOVE SURG SYN 8.5 E (GLOVE) ×6 IMPLANT
GLOVE SURG SYN 8.5 PF PI (GLOVE) ×6 IMPLANT
GLOVE SURG UNDER POLY LF SZ6.5 (GLOVE) ×3 IMPLANT
GLOVE SURG UNDER POLY LF SZ8.5 (GLOVE) ×3 IMPLANT
GOWN SRG LRG LVL 4 IMPRV REINF (GOWNS) ×2 IMPLANT
GOWN SRG XL LVL 3 NONREINFORCE (GOWNS) ×2 IMPLANT
GOWN STRL NON-REIN TWL XL LVL3 (GOWNS) ×1
GOWN STRL REIN LRG LVL4 (GOWNS) ×2
GRADUATE 1200CC STRL 31836 (MISCELLANEOUS) ×3 IMPLANT
GRAFT BNE CHIP CANC 1-8 40 (Bone Implant) IMPLANT
JACKSON PRATT 10 (INSTRUMENTS) ×2 IMPLANT
KIT INFUSE LRG II (Orthopedic Implant) ×1 IMPLANT
KIT PREVENA INCISION MGT20CM45 (CANNISTER) ×1 IMPLANT
KIT SPINAL PRONEVIEW (KITS) ×3 IMPLANT
MANIFOLD NEPTUNE II (INSTRUMENTS) ×3 IMPLANT
MARKER SKIN DUAL TIP RULER LAB (MISCELLANEOUS) ×5 IMPLANT
MARKER SPHERE PSV REFLC 13MM (MARKER) ×18 IMPLANT
NDL SAFETY ECLIPSE 18X1.5 (NEEDLE) ×2 IMPLANT
NEEDLE HYPO 18GX1.5 SHARP (NEEDLE) ×1
NEEDLE HYPO 22GX1.5 SAFETY (NEEDLE) ×3 IMPLANT
NS IRRIG 1000ML POUR BTL (IV SOLUTION) ×3 IMPLANT
PACK LAMINECTOMY NEURO (CUSTOM PROCEDURE TRAY) ×3 IMPLANT
PAD ARMBOARD 7.5X6 YLW CONV (MISCELLANEOUS) ×3 IMPLANT
PENCIL ELECTRO HAND CTR (MISCELLANEOUS) ×1 IMPLANT
PUTTY DBX 10CC (Bone Implant) ×2 IMPLANT
ROD SPINE CVD CREO 5.5X150 (Rod) ×2 IMPLANT
SCREW CREO SPINAL 6.5X45 (Screw) ×2 IMPLANT
SCREW CREO SPINAL 7.5X40 (Screw) ×2 IMPLANT
SCREW SPINAL CREO COCR 8.5X80 (Screw) ×2 IMPLANT
SCREW SPINE CREO 7.5X50 8.5X50 (Screw) ×6 IMPLANT
SPONGE GAUZE 2X2 8PLY STRL LF (GAUZE/BANDAGES/DRESSINGS) ×1 IMPLANT
SURGIFLO W/THROMBIN 8M KIT (HEMOSTASIS) ×4 IMPLANT
SUT DVC VLOC 3-0 CL 6 P-12 (SUTURE) ×4 IMPLANT
SUT ETHILON 3-0 FS-10 30 BLK (SUTURE) ×6
SUT VIC AB 0 CT1 18XCR BRD 8 (SUTURE) IMPLANT
SUT VIC AB 0 CT1 27 (SUTURE) ×2
SUT VIC AB 0 CT1 27XCR 8 STRN (SUTURE) ×4 IMPLANT
SUT VIC AB 0 CT1 8-18 (SUTURE) ×1
SUT VIC AB 2-0 CT1 18 (SUTURE) ×7 IMPLANT
SUTURE EHLN 3-0 FS-10 30 BLK (SUTURE) IMPLANT
SYR 10ML LL (SYRINGE) ×3 IMPLANT
SYR 20ML LL LF (SYRINGE) ×3 IMPLANT
SYR 30ML LL (SYRINGE) ×6 IMPLANT
SYR 3ML LL SCALE MARK (SYRINGE) ×3 IMPLANT
TOWEL OR 17X26 4PK STRL BLUE (TOWEL DISPOSABLE) ×9 IMPLANT
TRAY FOLEY MTR SLVR 16FR STAT (SET/KITS/TRAYS/PACK) ×1 IMPLANT
TUBING CONNECTING 10 (TUBING) ×3 IMPLANT

## 2021-05-08 NOTE — Op Note (Signed)
Indications: Mr. 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   Preoperative Note:   Risks of surgery discussed include: infection, bleeding, stroke, coma, death, paralysis, CSF leak, nerve/spinal cord injury, numbness, tingling, weakness, complex regional pain syndrome, recurrent stenosis and/or disc herniation, vascular injury, development of instability, neck/back pain, need for further surgery, persistent symptoms, development of deformity, and the risks of anesthesia. The patient understood these risks and agreed to proceed.  Operative Note:  1. Open Reduction internal fixation Sacral fracture 2. Posterolateral arthrodesis L2 to S2 3. Posterior segmental instrumentation L2 to S2 4. Lumbar decompression L2-3 and L5-S1 5. Harvesting of autograft via the same incision 6. Use of stereotaxis   The patient was brought to the Operating Room, intubated and turned into the prone position. All pressure points were checked and double checked. The prior incision was marked. The patient was prepped and draped in the  standard fashion. A full timeout was performed. Preoperative antibiotics were given. The incision was injected with local anesthetic.  The incision was opened with a scalpel, then the soft tissues divided with the Bovie. Self-retaining retractors were placed. The paraspinus muscles were reflected laterally in subperiosteal fashion until the transverse processes were visible.   The prior implants from L3-5 were identified.  A pseudoarthrosis was noted.  These implants were removed and handed off the field.  The sizes were marked.  We then attached the stereotactic array to the S1 spinous process.  The intraoperative CT scan was then taken and registered to the patient.  Using stereotactic guidance, the L2 and S1 pedicles were cannulated bilaterally.  We then placed globus Creo pedicle screws at L2 and S1.  The implants from L3-5 were upsized and replaced.  Using the stereotactic guidance, the drill was used to cannulate the sacrum across the sacroiliac joint.  The guided pedicle finder probe was then used to cannulate the ileum.  The ball-tipped probe was used to confirm no breaches.  The tract was tapped and then 8.5 x 80 mm screws were placed across the sacroiliac joint into the ileum bilaterally.  Thus, the terminus of the construct is in the pelvis.  After placement of all implants, CT scan was taken to confirm placement.  The right L2 screw was not in adequate position.  It was removed and replaced.  It was then confirmed using x-ray.  At this point, we utilized a high-speed drill to remove the right L2-3 and right L5-S1 facet joints.  The ligamentum flavum at each level was removed and the underlying nerve roots decompressed.  After the nerve roots were checked, it was noted that the L5 and S1 as well as L3 nerve roots were decompressed.  We then fashioned rods and secured them to the screw heads according to manufacturer specifications  The L5-S1 disc space was accessed from the right.  It was  opened using a knife and then cultured.  A piece of the disc was taken for pathology.   Final AP and lateral radiographs were taken to confirm placement of instrumentation and appropriate alignment. The wound was copiously irrigated, then the external surfaces of the remaining lamina, facet, and transverse processes from L2 to S2 were decorticated. A mixture of allograft and autograft was placed over the decorticated surfaces for arthrodesis.  A drain was placed subfascially.  An additional drain was placed over the fascia.  After hemostasis, the wound was closed in layers with 0 and 2-0 vicryl. 3-0 monocryl and a wound vac  was applied to the incision.  The patient was then flipped supine and extubated with incident. All counts were correct times 2 at the end of the case. No immediate complications were noted.  Manning Charity PA assisted in the entire procedure.  Venetia Night MD

## 2021-05-08 NOTE — Anesthesia Procedure Notes (Signed)
Procedure Name: Intubation Date/Time: 05/08/2021 10:40 AM Performed by: Hezzie Bump, CRNA Pre-anesthesia Checklist: Patient identified, Patient being monitored, Timeout performed, Emergency Drugs available and Suction available Patient Re-evaluated:Patient Re-evaluated prior to induction Oxygen Delivery Method: Circle system utilized Preoxygenation: Pre-oxygenation with 100% oxygen Induction Type: IV induction Ventilation: Mask ventilation without difficulty Laryngoscope Size: 3 and McGraph Grade View: Grade I Tube type: Oral Tube size: 7.5 mm Number of attempts: 1 Airway Equipment and Method: Stylet Placement Confirmation: ETT inserted through vocal cords under direct vision, positive ETCO2 and breath sounds checked- equal and bilateral Secured at: 21 cm Tube secured with: Tape Dental Injury: Teeth and Oropharynx as per pre-operative assessment
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
## 2021-05-08 NOTE — Transfer of Care (Signed)
Immediate Anesthesia Transfer of Care Note  Patient: Eddie Cowan  Procedure(s) Performed: L2-3 DECOMPRESSION, L5-S1 DECOMPRESSION (Spine Lumbar) L2-S2 POSTERIOR SPINAL FUSION WITH RELINE HARDWARE REMOVAL (Spine Lumbar) APPLICATION OF INTRAOPERATIVE CT SCAN (Spine Lumbar) APPLICATION OF WOUND VAC (Back)  Patient Location: PACU  Anesthesia Type:General  Level of Consciousness: drowsy  Airway & Oxygen Therapy: Patient Spontanous Breathing and Patient connected to face mask oxygen  Post-op Assessment: Report given to RN and Post -op Vital signs reviewed and stable  Post vital signs: Reviewed and stable  Last Vitals:  Vitals Value Taken Time  BP 153/92 05/08/21 1530  Temp    Pulse 81 05/08/21 1530  Resp 16 05/08/21 1530  SpO2 100 % 05/08/21 1530  Vitals shown include unvalidated device data.  Last Pain:  Vitals:   05/08/21 0854  TempSrc: Temporal  PainSc: 0-No pain         Complications: No notable events documented.

## 2021-05-08 NOTE — Anesthesia Preprocedure Evaluation (Signed)
Anesthesia Evaluation  Patient identified by MRN, date of birth, ID band Patient awake    Reviewed: Allergy & Precautions, H&P , NPO status , Patient's Chart, lab work & pertinent test results, reviewed documented beta blocker date and time   History of Anesthesia Complications Negative for: history of anesthetic complications  Airway Mallampati: II  TM Distance: >3 FB Neck ROM: full    Dental  (+) Dental Advidsory Given, Caps, Implants, Teeth Intact, Missing   Pulmonary neg shortness of breath, sleep apnea and Continuous Positive Airway Pressure Ventilation , neg COPD, neg recent URI,    Pulmonary exam normal breath sounds clear to auscultation       Cardiovascular Exercise Tolerance: Good hypertension, (-) angina+ DVT  (-) Past MI and (-) Cardiac Stents Normal cardiovascular exam(-) dysrhythmias (-) Valvular Problems/Murmurs Rhythm:regular Rate:Normal     Neuro/Psych PSYCHIATRIC DISORDERS Anxiety Depression negative neurological ROS     GI/Hepatic Neg liver ROS, GERD  ,  Endo/Other  negative endocrine ROS  Renal/GU negative Renal ROS  negative genitourinary   Musculoskeletal   Abdominal   Peds  Hematology negative hematology ROS (+)   Anesthesia Other Findings Past Medical History: No date: Anxiety No date: Arthritis     Comment:  back No date: Depression No date: DVT (deep venous thrombosis) (HCC)     Comment:  after surgery No date: ED (erectile dysfunction) No date: GERD (gastroesophageal reflux disease) No date: Headache     Comment:  migraines No date: Hyperlipidemia No date: Hypertension 04/03/2021: Shingles No date: Sleep apnea     Comment:  uses cpap   Reproductive/Obstetrics negative OB ROS                             Anesthesia Physical Anesthesia Plan  ASA: 2  Anesthesia Plan: General   Post-op Pain Management:    Induction: Intravenous  PONV Risk Score  and Plan: 2 and Ondansetron, Dexamethasone, Midazolam, Treatment may vary due to age or medical condition and Promethazine  Airway Management Planned: Oral ETT  Additional Equipment:   Intra-op Plan:   Post-operative Plan: Extubation in OR  Informed Consent: I have reviewed the patients History and Physical, chart, labs and discussed the procedure including the risks, benefits and alternatives for the proposed anesthesia with the patient or authorized representative who has indicated his/her understanding and acceptance.     Dental Advisory Given  Plan Discussed with: Anesthesiologist, CRNA and Surgeon  Anesthesia Plan Comments:         Anesthesia Quick Evaluation

## 2021-05-08 NOTE — Plan of Care (Signed)

## 2021-05-09 ENCOUNTER — Encounter: Payer: Self-pay | Admitting: Neurosurgery

## 2021-05-09 LAB — CBC
HCT: 31.4 % — ABNORMAL LOW (ref 39.0–52.0)
Hemoglobin: 10.1 g/dL — ABNORMAL LOW (ref 13.0–17.0)
MCH: 28.1 pg (ref 26.0–34.0)
MCHC: 32.2 g/dL (ref 30.0–36.0)
MCV: 87.2 fL (ref 80.0–100.0)
Platelets: 140 10*3/uL — ABNORMAL LOW (ref 150–400)
RBC: 3.6 MIL/uL — ABNORMAL LOW (ref 4.22–5.81)
RDW: 13.8 % (ref 11.5–15.5)
WBC: 11.6 10*3/uL — ABNORMAL HIGH (ref 4.0–10.5)
nRBC: 0 % (ref 0.0–0.2)

## 2021-05-09 LAB — CREATININE, SERUM
Creatinine, Ser: 0.74 mg/dL (ref 0.61–1.24)
GFR, Estimated: >60 mL/min (ref >60)

## 2021-05-09 MED ORDER — OXYMETAZOLINE HCL 0.05 % NA SOLN
1.0000 | Freq: Two times a day (BID) | NASAL | Status: AC
  Administered 2021-05-09 – 2021-05-12 (×6): 1 via NASAL
  Filled 2021-05-09: qty 15

## 2021-05-09 NOTE — Evaluation (Signed)
Occupational Therapy Evaluation Patient Details Name: Eddie Cowan MRN: [id number] DOB: 1955/10/18 Today's Date: 05/09/2021   History of Present Illness 66 yo male s/p L2-3 decompression, L5-S1 decompression, and L2-S2 posterior spinal fusion. PMH significant for fusion to lumbar spine, ACDF, anxiety, arthritis, depression, GERD, HTN, hyperlipidemia, MS, OSA (uses CPAP)   Clinical Impression   Pt seen for OT evaluation this date. Upon arrival to room, pt awake and seated upright in recliner. Pt pre-medicated for pain prior to session however reporting 8/10 pain at rest. Despite pain, pt agreeable to OT eval/tx. Prior to admission, pt reports he was MOD-I (increased time/effort and use of AD) with ADLs and functional mobility. Pt endorses 6 falls within the past 6 months (attributing to poor pain management).   Pt currently requires set-up assist for seated grooming tasks and MAX A for LB ADLs. Pt was educated in functional application of back precautions, log roll technique, AE/DME for bathing/dressing/ toileting, and home/routines modifications. Pt verbalized familiarity of education provided from prior surgeries. Handout provided to support recall and carry over of learned precautions/techniques. Pt was able to verbalize how to don/doff brace and how to perform LB dressing with use of AD (plan to assess at next session). This date, pt attempted stand pivot transfer to [facility] however ultimately unable d/t 10/10 pain in static standing. After static standing for ~20sec, pt returned to seated position in recliner. RN informed of pt's pain level. Pt would benefit from additional skilled OT services to maximize return to PLOF and minimize risk of future falls, injury, caregiver burden, and readmission. Upon discharge, recommend SNF (although pt and pt's wife declining at this time).     Recommendations for follow up therapy are one component of a multi-disciplinary discharge planning process, led by the  attending physician.  Recommendations may be updated based on patient status, additional functional criteria and insurance authorization.   Follow Up Recommendations  Skilled nursing-short term rehab (<3 hours/day)    Assistance Recommended at Discharge Intermittent Supervision/Assistance  Patient can return home with the following A lot of help with walking and/or transfers;A lot of help with bathing/dressing/bathroom;Help with stairs or ramp for entrance    Functional Status Assessment  Patient has had a recent decline in their functional status and demonstrates the ability to make significant improvements in function in a reasonable and predictable amount of time.  Equipment Recommendations  None recommended by OT (pt has all necessary DME)    Recommendations for Other Services       Precautions / Restrictions Precautions Precautions: Fall;Back Required Braces or Orthoses: Spinal Brace Spinal Brace: Lumbar corset;Applied in sitting position;Other (comment) Spinal Brace Comments: Per orders, may remove during dressing, bathing, and walk to/from bathroom Restrictions Weight Bearing Restrictions: No      Mobility Bed Mobility               General bed mobility comments: not assessed, pt in recliner pre/post session    Transfers Overall transfer level: Needs assistance Equipment used: Rolling walker (2 wheels) Transfers: Sit to/from Stand, Bed to chair/wheelchair/BSC Sit to Stand: Mod assist           General transfer comment: Requires MOD A for upward momentum. Unable to complete stand pivot transfer to [facility] d/t 10/10 pain in static standing position      Balance Overall balance assessment: Needs assistance Sitting-balance support: Bilateral upper extremity supported, Feet supported Sitting balance-Leahy Scale: Good     Standing balance support: Bilateral upper extremity supported, During functional  activity Standing balance-Leahy Scale: Fair Standing  balance comment: Able to maintain static standing balance for ~20 sec with b/l UE supported by RW                           ADL either performed or assessed with clinical judgement   ADL Overall ADL's : Needs assistance/impaired                                       General ADL Comments: Pt requires set-up assist for seated grooming tasks and MAX A for LB ADLs. Pt able to verbalize how to don/doff brace and how to perform LB dressing with use of AD. Pt attempt stand pivot transfer to [facility] however ultimately unable d/t 10/10 pain in static standing     Vision Ability to See in Adequate Light: 0 Adequate Patient Visual Report: No change from baseline       Perception     Praxis      Pertinent Vitals/Pain Pain Assessment Pain Assessment: 0-10 Pain Score: 8  Pain Location: back at rest (endorsed 10/10 pain during transfers) Pain Descriptors / Indicators: Aching Pain Intervention(s): Monitored during session, Premedicated before session, Repositioned     Hand Dominance     Extremity/Trunk Assessment Upper Extremity Assessment Upper Extremity Assessment: Overall WFL for tasks assessed   Lower Extremity Assessment Lower Extremity Assessment: Generalized weakness   Cervical / Trunk Assessment Cervical / Trunk Assessment: Back Surgery   Communication Communication Communication: No difficulties   Cognition Arousal/Alertness: Awake/alert Behavior During Therapy: WFL for tasks assessed/performed Overall Cognitive Status: Within Functional Limits for tasks assessed                                 General Comments: A&Ox4.     General Comments       Exercises Other Exercises Other Exercises: Pt educated in functional application of back precautions, log roll technique, AE/DME for bathing/dressing/ toileting, and home/routines modifications. Pt verbalized familiarity of all education/training provided from prior surgeries. Handout  provided to support recall and carry over of learned precautions/techniques.   Shoulder Instructions      Home Living Family/patient expects to be discharged to:: Private residence Living Arrangements: Spouse/significant other Available Help at Discharge: Family Type of Home: House Home Access: Stairs to enter [employer] of Steps: 5 Entrance Stairs-Rails: Right Home Layout: Two level;Able to live on main level with bedroom/bathroom     Bathroom Shower/Tub: Walk-in shower         Home Equipment: Agricultural consultant (2 wheels);BSC/3in1;Adaptive equipment Adaptive Equipment: Reacher;Sock aid        Prior Functioning/Environment Prior Level of Function : Independent/Modified Independent             Mobility Comments: Pt reports being MOD-I with RW for functional mobility. Endorses 6 falls within the past 6 months (attributing to poor pain management) ADLs Comments: Pt reports being MOD-I (increased time/effort and use of AD PRN) with ADLs        OT Problem List: Decreased strength;Decreased activity tolerance;Impaired balance (sitting and/or standing);Decreased knowledge of precautions;Pain      OT Treatment/Interventions: Self-care/ADL training;Therapeutic exercise;Energy conservation;DME and/or AE instruction;Therapeutic activities;Patient/family education;Balance training    OT Goals(Current goals can be found in the care plan section) Acute Rehab OT Goals Patient Stated  Goal: to return home OT Goal Formulation: With patient/family Time For Goal Achievement: 05/23/21 Potential to Achieve Goals: Good ADL Goals Pt Will Perform Lower Body Dressing: with min assist;with adaptive equipment;sit to/from stand Pt Will Transfer to Toilet: with min guard assist;stand pivot transfer;bedside commode Pt Will Perform Toileting - Clothing Manipulation and hygiene: with min guard assist;sitting/lateral leans  OT Frequency: Min 3X/week    Co-evaluation               AM-PAC OT "6 Clicks" Daily Activity     Outcome Measure Help from another person eating meals?: None Help from another person taking care of personal grooming?: A Little Help from another person toileting, which includes using toliet, bedpan, or urinal?: A Lot Help from another person bathing (including washing, rinsing, drying)?: A Lot Help from another person to put on and taking off regular upper body clothing?: A Lot Help from another person to put on and taking off regular lower body clothing?: A Lot 6 Click Score: 15   End of Session Equipment Utilized During Treatment: Rolling walker (2 wheels);Oxygen;Back brace Nurse Communication: Mobility status  Activity Tolerance: Patient limited by pain Patient left: in chair;with call bell/phone within reach;with bed alarm set;with family/visitor present  OT Visit Diagnosis: Unsteadiness on feet (R26.81);History of falling (Z91.81);Pain Pain - part of body:  (back)                Time: 1019-1050 OT Time Calculation (min): 31 min Charges:  OT General Charges $OT Visit: 1 Visit OT Evaluation $OT Eval Moderate Complexity: 1 Mod OT Treatments $Self Care/Home Management : 8-22 mins  Matthew Folks, OTR/L [facility] [phone]

## 2021-05-09 NOTE — Plan of Care (Signed)

## 2021-05-09 NOTE — Progress Notes (Signed)
16 Fr Foley catheter discontinued per order at this time.Pt tolerated procedure w/o any c/o or discomfort.10cc of NS withdrawn from the balloon,with catheter tip intact.Pt encouraged to consume fluids and expected to void in 6hrs time per protocol.no verbal c/o or any ssx of distress.will cont to to monitor.

## 2021-05-09 NOTE — Anesthesia Postprocedure Evaluation (Signed)
Anesthesia Post Note  Patient: Eddie Cowan  Procedure(s) Performed: L2-3 DECOMPRESSION, L5-S1 DECOMPRESSION (Spine Lumbar) L2-S2 POSTERIOR SPINAL FUSION WITH RELINE HARDWARE REMOVAL (Spine Lumbar) APPLICATION OF INTRAOPERATIVE CT SCAN (Spine Lumbar) APPLICATION OF WOUND VAC (Back)  Patient location during evaluation: PACU Anesthesia Type: General Level of consciousness: awake and alert Pain management: pain level controlled Vital Signs Assessment: post-procedure vital signs reviewed and stable Respiratory status: spontaneous breathing, nonlabored ventilation, respiratory function stable and patient connected to nasal cannula oxygen Cardiovascular status: blood pressure returned to baseline and stable Postop Assessment: no apparent nausea or vomiting Anesthetic complications: no   No notable events documented.   Last Vitals:  Vitals:   05/09/21 0458 05/09/21 0743  BP: 119/66 117/63  Pulse: 66 68  Resp: 18 15  Temp: 36.8 C 36.4 C  SpO2: 98% 98%    Last Pain:  Vitals:   05/09/21 0932  TempSrc:   PainSc: 10-Worst pain ever                 Corinda Gubler

## 2021-05-09 NOTE — Progress Notes (Signed)
° °   Attending Progress Note  History: Eddie Cowan is s/p L2-S2 PSF with L2-3 and L5-S1 decompression.   Physical Exam: Vitals:   05/09/21 0458 05/09/21 0743  BP: 119/66 117/63  Pulse: 66 68  Resp: 18 15  Temp: 98.2 F (36.8 C) 97.6 F (36.4 C)  SpO2: 98% 98%    AA Ox3 CNI  Strength:5/5 throughout BLE  Incision covered with wound vac HV #1 160 HV#2 50  Data:  Recent Labs  Lab 05/09/21 0634  CREATININE 0.74   No results for input(s): AST, ALT, ALKPHOS in the last 168 hours.  Invalid input(s): TBILI   Recent Labs  Lab 05/09/21 0634  WBC 11.6*  HGB 10.1*  HCT 31.4*  PLT 140*   No results for input(s): APTT, INR in the last 168 hours.       Other tests/results: cultures NTD  Assessment/Plan:  Eddie Cowan is a 66 y.o s/p L2-S2 PSF.   - mobilize - pain control - DVT prophylaxis - PTOT - will keep Hvs today  Manning Charity  Department of Neurosurgery

## 2021-05-09 NOTE — Progress Notes (Signed)
Physical Therapy Treatment Patient Details Name: Eddie Cowan MRN: [id number] DOB: Oct 15, 1955 Today's Date: 05/09/2021   History of Present Illness Pt is a 66 y.o. male with c/o difficulty with walking, changes in his posture, and R leg pain; has S1 fx.  H/o L3-5 PSF August 2021.  05/08/21 pt s/p ORIF sacral fx, posterolateral arthrodesis L2 to S2, posterior segmental instrumentation L2 to S2, and lumbar decompression L2-3 and L5-S1.  PMH includes MS, numbness/tingling L>R hands, c-spine surgery, B femur repair, R shoulder dislocation repair R 2008, sleep apnea with CPAP, htn, h/o DVT, anxiety, migraines, htn, and shingles.    PT Comments    Pt resting in recliner upon PT arrival and agreeable to PT session (pt pre-medicated with pain meds for PT session).  Back pain 6/10 at rest beginning/end of session but increased with activity.  Pt sat on [facility] briefly to attempt toileting.  During session pt min to mod assist with transfers using RW and CGA to min assist to ambulate 20 feet with RW (despite having back pain, pt requesting to ambulate during session and then get back to bed).  Pt appearing more comfortable in bed end of session (pt repositioned for comfort).  Will continue to focus on strengthening and progressive functional mobility per pt tolerance.    Recommendations for follow up therapy are one component of a multi-disciplinary discharge planning process, led by the attending physician.  Recommendations may be updated based on patient status, additional functional criteria and insurance authorization.  Follow Up Recommendations  Home health PT     Assistance Recommended at Discharge Frequent or constant Supervision/Assistance  Patient can return home with the following A little help with walking and/or transfers;A little help with bathing/dressing/bathroom;Assistance with cooking/housework;Assist for transportation;Help with stairs or ramp for entrance   Equipment Recommendations   Rolling walker (2 wheels);BSC/3in1    Recommendations for Other Services OT consult     Precautions / Restrictions Precautions Precautions: Fall;Back Precaution Comments: 2 hemovacs; 1 wound vac Required Braces or Orthoses: Spinal Brace Spinal Brace: Lumbar corset;Applied in sitting position;Other (comment) Spinal Brace Comments: Per orders, may remove during dressing, bathing, and walk to/from bathroom; may remove when in bed Restrictions Weight Bearing Restrictions: No     Mobility  Bed Mobility Overal bed mobility: Needs Assistance Bed Mobility: Sit to Sidelying Rolling: Min assist, Mod assist (R sidelying to supine)     Sit to sidelying: Mod assist, Max assist (sit to R sidelying) General bed mobility comments: vc's for logrolling technique; assist for trunk and B LE's    Transfers Overall transfer level: Needs assistance Equipment used: Rolling walker (2 wheels) Transfers: Sit to/from Stand, Bed to chair/wheelchair/BSC Sit to Stand: Min assist, Mod assist   Step pivot transfers: Min assist (stand step turn recliner to [facility])       General transfer comment: x1 trial standing from recliner and x1 trial standing from [facility]; vc's for UE/LE placement and overall positioning; vc's for transverse abdominal contraction to support core during transfers    Ambulation/Gait Ambulation/Gait assistance: Min guard, Min assist Gait Distance (Feet): 20 Feet Assistive device: Rolling walker (2 wheels)   Gait velocity: decreased     General Gait Details: decreased B LE step length/foot clearance/heelstrike (vc's to increase step length with mild improvement noted); vc's for positioning within walker   Stairs             Wheelchair Mobility    Modified Rankin (Stroke Patients Only)       Balance  Overall balance assessment: Needs assistance Sitting-balance support: Single extremity supported, Feet supported Sitting balance-Leahy Scale: Good Sitting balance -  Comments: steady sitting reaching within BOS   Standing balance support: Bilateral upper extremity supported, During functional activity Standing balance-Leahy Scale: Fair Standing balance comment: pt requiring B UE support on walker for ambulation                            Cognition Arousal/Alertness: Awake/alert Behavior During Therapy: WFL for tasks assessed/performed Overall Cognitive Status: Within Functional Limits for tasks assessed                                 General Comments: A&Ox4; likes to joke around        Exercises      General Comments General comments (skin integrity, edema, etc.): 2 hemovac drains and wound vac appearing intact beginning/end of session.  Nursing cleared pt for participation in physical therapy.  Pt agreeable to PT session.  [name] present during session.      Pertinent Vitals/Pain Pain Assessment Pain Assessment: 0-10 Pain Score: 6  Pain Location: back at rest Pain Descriptors / Indicators: Aching Pain Intervention(s): Limited activity within patient's tolerance, Monitored during session, Premedicated before session, Repositioned Vitals (HR and O2 on room air) stable and WFL throughout treatment session.    Home Living        Prior Function            PT Goals (current goals can now be found in the care plan section) Acute Rehab PT Goals Patient Stated Goal: to go home from hospital (get HHPT) PT Goal Formulation: With patient/family Time For Goal Achievement: 05/23/21 Potential to Achieve Goals: Fair Progress towards PT goals: Progressing toward goals    Frequency    BID      PT Plan Current plan remains appropriate    Co-evaluation              AM-PAC PT "6 Clicks" Mobility   Outcome Measure  Help needed turning from your back to your side while in a flat bed without using bedrails?: A Little Help needed moving from lying on your back to sitting on the side of a flat bed without  using bedrails?: A Lot Help needed moving to and from a bed to a chair (including a wheelchair)?: A Little Help needed standing up from a chair using your arms (e.g., wheelchair or bedside chair)?: A Lot Help needed to walk in hospital room?: A Little Help needed climbing 3-5 steps with a railing? : A Lot 6 Click Score: 15    End of Session Equipment Utilized During Treatment: Gait belt (up high away from surgery, drains, and back brace) Activity Tolerance: Patient limited by pain Patient left: in bed;with call bell/phone within reach;with bed alarm set;with family/visitor present;with SCD's reapplied;Other (comment) (B heels floating via pillow support) Nurse Communication: Mobility status;Precautions;Other (comment) (pt's pain status) PT Visit Diagnosis: Other abnormalities of gait and mobility (R26.89);Muscle weakness (generalized) (M62.81);History of falling (Z91.81);Repeated falls (R29.6);Pain     Time: 1107-1150 PT Time Calculation (min) (ACUTE ONLY): 43 min  Charges:  $Gait Training: 8-22 mins $Therapeutic Activity: 23-37 mins                    Hendricks Limes, PT 05/09/21, 4:02 PM

## 2021-05-09 NOTE — Evaluation (Signed)
Physical Therapy Evaluation Patient Details Name: Eddie Cowan MRN: [id number] DOB: 1955/04/09 Today's Date: 05/09/2021  History of Present Illness  Pt is a 66 y.o. male with c/o difficulty with walking, changes in his posture, and R leg pain; has S1 fx.  H/o L3-5 PSF August 2021.  05/08/21 pt s/p ORIF sacral fx, posterolateral arthrodesis L2 to S2, posterior segmental instrumentation L2 to S2, and lumbar decompression L2-3 and L5-S1.  PMH includes MS, numbness/tingling L>R hands, c-spine surgery, B femur repair, R shoulder dislocation repair R 2008, sleep apnea with CPAP, htn, h/o DVT, anxiety, migraines, htn, and shingles.  Clinical Impression  Prior to surgery, pt was modified independent ambulating with RW; lives with his wife and 44 y.o. granddaughter on main level of home with 5-6 STE R railing; h/o falls (pt reports d/t poor pain management).  Currently pt is min to mod assist log-rolling towards R side; max assist R sidelying to sitting (2nd assist managing lines); mod assist with transfers using RW; and min assist to ambulate a few feet bed to recliner with RW use.  Limited distance ambulating d/t back pain.  Pain low back 5-6/10 at rest beginning/end of session but increased with activity.  Pt would benefit from skilled PT to address noted impairments and functional limitations (see below for any additional details).  Upon hospital discharge, pt would benefit from [place] and 24/7 assist from family.       Recommendations for follow up therapy are one component of a multi-disciplinary discharge planning process, led by the attending physician.  Recommendations may be updated based on patient status, additional functional criteria and insurance authorization.  Follow Up Recommendations Home health PT    Assistance Recommended at Discharge Frequent or constant Supervision/Assistance  Patient can return home with the following  A little help with walking and/or transfers;A little help with  bathing/dressing/bathroom;Assistance with cooking/housework;Assist for transportation;Help with stairs or ramp for entrance    Equipment Recommendations Rolling walker (2 wheels);BSC/3in1  Recommendations for Other Services  OT consult    Functional Status Assessment Patient has had a recent decline in their functional status and demonstrates the ability to make significant improvements in function in a reasonable and predictable amount of time.     Precautions / Restrictions Precautions Precautions: Fall;Back Precaution Comments: 2 hemovacs; 1 wound vac Required Braces or Orthoses: Spinal Brace Spinal Brace: Lumbar corset;Applied in sitting position;Other (comment) Spinal Brace Comments: Per orders, may remove during dressing, bathing, and walk to/from bathroom; may remove when in bed Restrictions Weight Bearing Restrictions: No      Mobility  Bed Mobility Overal bed mobility: Needs Assistance Bed Mobility: Rolling, Sidelying to Sit Rolling: Min assist, Mod assist (supine to R sidelying) Sidelying to sit: Max assist, +2 for safety/equipment (R sidelying to sitting)       General bed mobility comments: vc's for logrolling technique; assist for trunk    Transfers Overall transfer level: Needs assistance Equipment used: Rolling walker (2 wheels) Transfers: Sit to/from Stand Sit to Stand: Mod assist           General transfer comment: assist to stand from mildly elevated bed up to RW; assist to control descent sitting in recliner; vc's for positioning during transfers and UE/LE placement    Ambulation/Gait Ambulation/Gait assistance: Min assist Gait Distance (Feet): 3 Feet (bed to recliner) Assistive device: Rolling walker (2 wheels)   Gait velocity: decreased     General Gait Details: decreased B LE step length/foot clearance/heelstrike  Stairs  Wheelchair Mobility    Modified Rankin (Stroke Patients Only)       Balance Overall balance  assessment: Needs assistance Sitting-balance support: Single extremity supported, Feet supported Sitting balance-Leahy Scale: Fair Sitting balance - Comments: steady static sitting   Standing balance support: Bilateral upper extremity supported, During functional activity Standing balance-Leahy Scale: Fair Standing balance comment: steady standing static with B UE support on RW                             Pertinent Vitals/Pain Pain Assessment Pain Assessment: 0-10 Pain Score: 6  Pain Location: pt's back (at rest) Pain Descriptors / Indicators: Aching Pain Intervention(s): Limited activity within patient's tolerance, Monitored during session, Premedicated before session, Repositioned, Patient requesting pain meds-RN notified, RN gave pain meds during session Vitals (HR and O2 on room air) stable and WFL throughout treatment session.    Home Living Family/patient expects to be discharged to:: Private residence Living Arrangements: Spouse/significant other Available Help at Discharge: Family (spouse Eddie Cowan) and 8 y.o. granddaughter) Type of Home: House Home Access: Stairs to enter Entrance Stairs-Rails: Right Entrance Stairs-Number of Steps: [place]: Two level;Able to live on main level with bedroom/bathroom Home Equipment: Rolling Walker (2 wheels);BSC/3in1;Adaptive equipment      Prior Function Prior Level of Function : Independent/Modified Independent             Mobility Comments: Pt reports 10 falls in past 6 months (d/t poor pain management); modified independent ambulating with RW baseline ADLs Comments: Per OT eval "Pt reports being MOD-I (increased time/effort and use of AD PRN) with ADLs"     Hand Dominance        Extremity/Trunk Assessment   Upper Extremity Assessment Upper Extremity Assessment: Overall WFL for tasks assessed    Lower Extremity Assessment Lower Extremity Assessment:  (at least 3-/5 B hip flexion (limited d/t back  pain); at least 3/5 AROM knee flexion/extension and DF/PF)    Cervical / Trunk Assessment Cervical / Trunk Assessment: Back Surgery  Communication   Communication: No difficulties  Cognition Arousal/Alertness: Awake/alert Behavior During Therapy: WFL for tasks assessed/performed Overall Cognitive Status: Within Functional Limits for tasks assessed                                 General Comments: A&Ox4; likes to joke around        General Comments General comments (skin integrity, edema, etc.): 2 hemovac drains and wound vac appearing intact beginning/end of session.  Nursing cleared pt for participation in physical therapy.  Pt agreeable to PT session.  [name] present during session.    Exercises  Pt educated on spinal precautions and use of brace.   Assessment/Plan    PT Assessment Patient needs continued PT services  PT Problem List Decreased strength;Decreased activity tolerance;Decreased balance;Decreased mobility;Decreased knowledge of use of DME;Decreased knowledge of precautions;Pain;Decreased skin integrity       PT Treatment Interventions DME instruction;Gait training;Stair training;Functional mobility training;Therapeutic activities;Therapeutic exercise;Balance training;Patient/family education    PT Goals (Current goals can be found in the Care Plan section)  Acute Rehab PT Goals Patient Stated Goal: to go home from hospital (get HHPT) PT Goal Formulation: With patient/family Time For Goal Achievement: 05/23/21 Potential to Achieve Goals: Fair    Frequency BID     Co-evaluation  AM-PAC PT "6 Clicks" Mobility  Outcome Measure Help needed turning from your back to your side while in a flat bed without using bedrails?: A Little Help needed moving from lying on your back to sitting on the side of a flat bed without using bedrails?: A Lot Help needed moving to and from a bed to a chair (including a wheelchair)?: A Little Help  needed standing up from a chair using your arms (e.g., wheelchair or bedside chair)?: A Lot Help needed to walk in hospital room?: A Little Help needed climbing 3-5 steps with a railing? : A Lot 6 Click Score: 15    End of Session Equipment Utilized During Treatment: Gait belt (up high away from drains and surgery and back brace) Activity Tolerance: Patient limited by pain Patient left: in chair;with call bell/phone within reach;with chair alarm set;with nursing/sitter in room;with family/visitor present;with SCD's reapplied Nurse Communication: Mobility status;Precautions;Other (comment) (pt's pain status) PT Visit Diagnosis: Other abnormalities of gait and mobility (R26.89);Muscle weakness (generalized) (M62.81);History of falling (Z91.81);Repeated falls (R29.6);Pain    Time: [id number] PT Time Calculation (min) (ACUTE ONLY): 48 min   Charges:   PT Evaluation $PT Eval Low Complexity: 1 Low PT Treatments $Therapeutic Activity: 23-37 mins       Leitha Bleak, PT 05/09/21, 3:39 PM

## 2021-05-10 NOTE — TOC Progression Note (Signed)
Transition of Care [facility]) - Progression Note    Patient Details  Name: Eddie Cowan MRN: [id number] Date of Birth: 08-May-1955  [facility] CM/SW Contact  Marlowe Sax, RN Phone Number: 05/10/2021, 3:44 PM  Clinical Narrative:   The patient would like to use [facility] as he has in the past I called [facility] and requested them to accept the patient, They requested that a faxed referral be sent to [phone], I requested HH orders from Physician to send in to [facility] for referral for [facility] PT          Expected Discharge Plan and Services                                                 Social Determinants of Health (SDOH) Interventions    Readmission Risk Interventions No flowsheet data found.

## 2021-05-10 NOTE — Progress Notes (Signed)
Physical Therapy Treatment Patient Details Name: Eddie Cowan MRN: [id number] DOB: [birth year] Today's Date: 05/10/2021   History of Present Illness Pt is a 66 y.o. male with c/o difficulty with walking, changes in his posture, and R leg pain; has S1 fx.  H/o L3-5 PSF August 2021.  05/08/21 pt s/p ORIF sacral fx, posterolateral arthrodesis L2 to S2, posterior segmental instrumentation L2 to S2, and lumbar decompression L2-3 and L5-S1.  PMH includes MS, numbness/tingling L>R hands, c-spine surgery, B femur repair, R shoulder dislocation repair R 2008, sleep apnea with CPAP, htn, h/o DVT, anxiety, migraines, htn, and shingles.    PT Comments    Pt was sitting in recliner with supportive spouse at bedside. Lengthy discussion about DC disposition and DC needs. PT/Spouse do not want rehab at DC and state they are confident they can safely manage at home. Pt was wearing LSO throughout session. He requested to have [facility] services follow him at [place]. Overall was able to advance abilities this session. Stood to RW (CGA) and ambulated 100 ft with CGA. Pt required a lot of assistance to exit bed earlier this date. " I usually sleep in recliner at home." Author discussed acute PT goals. Will perform stair training and advance gait distances tomorrow. Overall he is improving. Will need 24/7 assistance at DC. Spouse said between herself and their kids, " Someone will be with him all the time."    Recommendations for follow up therapy are one component of a multi-disciplinary discharge planning process, led by the attending physician.  Recommendations may be updated based on patient status, additional functional criteria and insurance authorization.  Follow Up Recommendations  Home health PT (pt/spouse feel they can manage at home with [facility] services at [place].)     Assistance Recommended at Discharge Frequent or constant Supervision/Assistance  Patient can return home with the following A little help with walking  and/or transfers;A little help with bathing/dressing/bathroom;Assistance with cooking/housework;Assist for transportation;Help with stairs or ramp for entrance   Equipment Recommendations  Rolling walker (2 wheels);BSC/3in1       Precautions / Restrictions Precautions Precautions: Fall;Back Precaution Comments: 2 hemovacs; 1 wound vac Required Braces or Orthoses: Spinal Brace Spinal Brace: Lumbar corset;Applied in sitting position;Other (comment) Spinal Brace Comments: Per orders, may remove during dressing, bathing, and walk to/from bathroom; may remove when in bed Restrictions Weight Bearing Restrictions: No     Mobility  Bed Mobility Overal bed mobility: Needs Assistance Bed Mobility: Rolling, Sidelying to Sit, Supine to Sit Rolling: Min assist, Mod assist Sidelying to sit: Max assist Supine to sit: Max assist     General bed mobility comments: Pt was in recliner pre/post session with LSO on    Transfers Overall transfer level: Needs assistance Equipment used: Rolling walker (2 wheels) Transfers: Sit to/from Stand Sit to Stand: Min guard           General transfer comment: Pt was able to demonstrate safe ability to stand form recliner with CGA only. He did require min assist to slide hips back into recliner further after ambulation.    Ambulation/Gait Ambulation/Gait assistance: Min guard Gait Distance (Feet): 100 Feet Assistive device: Rolling walker (2 wheels) Gait Pattern/deviations: Step-through pattern, Antalgic Gait velocity: decreased     General Gait Details: Pt was able to advance gait and demonstrate improving overall mobility and safety. will perform lap and stairs next date. Pt is aware.     Balance Overall balance assessment: Needs assistance Sitting-balance support: Feet supported, Bilateral upper extremity  supported Sitting balance-Leahy Scale: Fair Sitting balance - Comments: pt was able to maintain sitting balance with BUE support with close  supervision however pt has LOB in sitting without UE support   Standing balance support: Bilateral upper extremity supported, During functional activity Standing balance-Leahy Scale: Fair Standing balance comment: pt has extensive history of falls. High fall risk         Cognition Arousal/Alertness: Awake/alert Behavior During Therapy: WFL for tasks assessed/performed Overall Cognitive Status: Within Functional Limits for tasks assessed      General Comments: Pt is A and O x 4. Supportive spouse present throughout. Lengthy discussion about DC recommendatiosn and DC planning.           General Comments General comments (skin integrity, edema, etc.): Lengthy discussion with pt/pt's spouse, OT, and CM about plan going forward. Pt and spouse do not feel they will need rehab at DC. Prefer      Pertinent Vitals/Pain Pain Assessment Pain Assessment: 0-10 Pain Score: 7  Pain Location: back at rest Pain Descriptors / Indicators: Aching Pain Intervention(s): Limited activity within patient's tolerance, Monitored during session, Repositioned     PT Goals (current goals can now be found in the care plan section) Acute Rehab PT Goals Patient Stated Goal: Go home with the right pain medications Progress towards PT goals: Progressing toward goals    Frequency    BID      PT Plan Discharge plan needs to be updated       AM-PAC PT "6 Clicks" Mobility   Outcome Measure  Help needed turning from your back to your side while in a flat bed without using bedrails?: A Lot Help needed moving from lying on your back to sitting on the side of a flat bed without using bedrails?: A Lot Help needed moving to and from a bed to a chair (including a wheelchair)?: A Lot Help needed standing up from a chair using your arms (e.g., wheelchair or bedside chair)?: A Lot Help needed to walk in hospital room?: None Help needed climbing 3-5 steps with a railing? : A Lot 6 Click Score: 14    End  of Session Equipment Utilized During Treatment: Gait belt Activity Tolerance: Patient tolerated treatment well Patient left: in chair;with call bell/phone within reach;with chair alarm set;with nursing/sitter in room;with family/visitor present Nurse Communication: Mobility status;Precautions;Other (comment) PT Visit Diagnosis: Other abnormalities of gait and mobility (R26.89);Muscle weakness (generalized) (M62.81);History of falling (Z91.81);Repeated falls (R29.6);Pain     Time: 1240-1305 PT Time Calculation (min) (ACUTE ONLY): 25 min  Charges:  $Gait Training: 8-22 mins $Therapeutic Activity: 8-22 mins                     Jetta Lout PTA 05/10/21, 2:25 PM

## 2021-05-10 NOTE — Progress Notes (Signed)
Occupational Therapy Treatment Patient Details Name: Eddie Cowan MRN: [id number] DOB: [birth year] Today's Date: 05/10/2021   History of present illness Pt is a 66 y.o. male with c/o difficulty with walking, changes in his posture, and R leg pain; has S1 fx.  H/o L3-5 PSF August 2021.  05/08/21 pt s/p ORIF sacral fx, posterolateral arthrodesis L2 to S2, posterior segmental instrumentation L2 to S2, and lumbar decompression L2-3 and L5-S1.  PMH includes MS, numbness/tingling L>R hands, c-spine surgery, B femur repair, R shoulder dislocation repair R 2008, sleep apnea with CPAP, htn, h/o DVT, anxiety, migraines, htn, and shingles.   OT comments  Pt seen for OT treatment on this date. Upon arrival to room, pt awake and seated upright in recliner. Pt reporting 7/10 pain throughout session (pre-medicated for pain before), however agreeable and motivated to participate. [name] present and encouraging throughout session. Pt required MOD A for doffing/donning lumbar corset while seated in recliner. Pt performed sit>stand transfer from recliner and walked 41ft with RW requiring only MIN GUARD. Pt performed transfer to/from [facility] requiring MIN GUARD however was unable to void. Once standing, pt required MIN A for maintaining standing balance as he used urinal. Pt left sitting upright in recliner with all needs within reach. Pt is making good progress towards goals and continues to benefit from skilled OT services to maximize return to PLOF and minimize risk of future falls, injury, caregiver burden, and readmission. Discharge recommendation updated to [facility] to reflect pt's progress.    Recommendations for follow up therapy are one component of a multi-disciplinary discharge planning process, led by the attending physician.  Recommendations may be updated based on patient status, additional functional criteria and insurance authorization.    Follow Up Recommendations  Home health OT    Assistance Recommended at  Discharge Intermittent Supervision/Assistance  Patient can return home with the following  A little help with walking and/or transfers;A little help with bathing/dressing/bathroom;Help with stairs or ramp for entrance   Equipment Recommendations  None recommended by OT (pt has all necessary DME)       Precautions / Restrictions Precautions Precautions: Fall;Back Precaution Comments: 2 hemovacs; 1 wound vac Required Braces or Orthoses: Spinal Brace Spinal Brace: Lumbar corset;Applied in sitting position;Other (comment) Spinal Brace Comments: Per orders, may remove during dressing, bathing, and walk to/from bathroom; may remove when in bed Restrictions Weight Bearing Restrictions: No       Mobility Bed Mobility               General bed mobility comments: Pt was in recliner pre/post session    Transfers Overall transfer level: Needs assistance Equipment used: Rolling walker (2 wheels) Transfers: Sit to/from Stand Sit to Stand: Min guard                 Balance Overall balance assessment: Needs assistance Sitting-balance support: Feet supported, Bilateral upper extremity supported Sitting balance-Leahy Scale: Fair     Standing balance support: During functional activity, Bilateral upper extremity supported, Reliant on assistive device for balance Standing balance-Leahy Scale: Fair                             ADL either performed or assessed with clinical judgement   ADL Overall ADL's : Needs assistance/impaired                 Upper Body Dressing : Moderate assistance;Sitting Upper Body Dressing Details (indicate cue type and reason): to  don/doff back brace     Toilet Transfer: Min guard;BSC/3in1;Rolling walker (2 wheels);Ambulation Toilet Transfer Details (indicate cue type and reason): Requires cues for safe hand placement with RW Toileting- Clothing Manipulation and Hygiene: Minimal assistance;Sit to/from stand Toileting - Clothing  Manipulation Details (indicate cue type and reason): while standing, requires MIN A for steadying while using urinal to urinate     Functional mobility during ADLs: Min guard;Rolling walker (2 wheels) (to walk 62ft)      Extremity/Trunk Assessment Upper Extremity Assessment Upper Extremity Assessment: Overall WFL for tasks assessed   Lower Extremity Assessment Lower Extremity Assessment: Generalized weakness   Cervical / Trunk Assessment Cervical / Trunk Assessment: Back Surgery     Cognition Arousal/Alertness: Awake/alert Behavior During Therapy: WFL for tasks assessed/performed Overall Cognitive Status: Within Functional Limits for tasks assessed                                 General Comments: Pt is A and O x 4. Supportive spouse present throughout.              General Comments Lengthy discussion with pt/pt's spouse, OT, and CM about plan going forward. Pt and spouse do not feel they will need rehab at DC. Prefer    Pertinent Vitals/ Pain       Pain Assessment Pain Assessment: 0-10 Pain Score: 7  Pain Location: back at rest Pain Descriptors / Indicators: Aching Pain Intervention(s): Limited activity within patient's tolerance, Monitored during session, Repositioned, Premedicated before session         Frequency  Min 3X/week        Progress Toward Goals  OT Goals(current goals can now be found in the care plan section)  Progress towards OT goals: Progressing toward goals  Acute Rehab OT Goals Patient Stated Goal: to return home OT Goal Formulation: With patient/family Time For Goal Achievement: 05/23/21 Potential to Achieve Goals: Good  Plan Discharge plan needs to be updated;Frequency remains appropriate       AM-PAC OT "6 Clicks" Daily Activity     Outcome Measure   Help from another person eating meals?: None Help from another person taking care of personal grooming?: None Help from another person toileting, which includes using  toliet, bedpan, or urinal?: A Little Help from another person bathing (including washing, rinsing, drying)?: A Lot Help from another person to put on and taking off regular upper body clothing?: A Lot Help from another person to put on and taking off regular lower body clothing?: A Lot 6 Click Score: 17    End of Session Equipment Utilized During Treatment: Rolling walker (2 wheels);Back brace  OT Visit Diagnosis: Unsteadiness on feet (R26.81);History of falling (Z91.81);Pain Pain - part of body:  (back)   Activity Tolerance Patient tolerated treatment well   Patient Left in chair;with call bell/phone within reach;with bed alarm set;with family/visitor present   Nurse Communication Mobility status        Time: 1275-1700 OT Time Calculation (min): 31 min  Charges: OT General Charges $OT Visit: 1 Visit OT Treatments $Self Care/Home Management : 23-37 mins  Matthew Folks, OTR/L [facility] [phone]

## 2021-05-10 NOTE — Progress Notes (Addendum)
Physical Therapy Treatment Patient Details Name: Eddie Cowan MRN: [id number] DOB: Apr 18, 1955 Today's Date: 05/10/2021   History of Present Illness Pt is a 66 y.o. male with c/o difficulty with walking, changes in his posture, and R leg pain; has S1 fx.  H/o L3-5 PSF August 2021.  05/08/21 pt s/p ORIF sacral fx, posterolateral arthrodesis L2 to S2, posterior segmental instrumentation L2 to S2, and lumbar decompression L2-3 and L5-S1.  PMH includes MS, numbness/tingling L>R hands, c-spine surgery, B femur repair, R shoulder dislocation repair R 2008, sleep apnea with CPAP, htn, h/o DVT, anxiety, migraines, htn, and shingles.    PT Comments    Pt is A and O x 4 with supportive spouse at bedside. He is agreeable to session however required IV pain meds administered prior to session to be able to participate. Pt is extremely slow moving due to pain. He requires extensive assistance to b able to roll R to short sit EOB. Pt presents with poor sitting balance at EOB without BUE support. Had one occasion of LOB in sitting when reaching with +1 arm. Pt did stand from elevated bed height with assist + gait belt. He urinated 150 Mls prior to  taking several steps from EOB to recliner. Distance limited by pain and having visitors arrive. Pt needed max assist to properly apply LSO brace. Author will return this afternoon to advance mobility, strength, and Independence with ADLs. Author feels pt will benefit from STR at DC to address deficits prior to returning home however will continue to monitor progress this afternoon.    Recommendations for follow up therapy are one component of a multi-disciplinary discharge planning process, led by the attending physician.  Recommendations may be updated based on patient status, additional functional criteria and insurance authorization.  Follow Up Recommendations  Skilled nursing-short term rehab (<3 hours/day)     Assistance Recommended at Discharge Frequent or  constant Supervision/Assistance  Patient can return home with the following A little help with walking and/or transfers;A little help with bathing/dressing/bathroom;Assistance with cooking/housework;Assist for transportation;Help with stairs or ramp for entrance   Equipment Recommendations  Rolling walker (2 wheels);BSC/3in1       Precautions / Restrictions Precautions Precautions: Fall;Back Precaution Comments: 2 hemovacs; 1 wound vac Required Braces or Orthoses: Spinal Brace Spinal Brace: Lumbar corset;Applied in sitting position;Other (comment) Spinal Brace Comments: Per orders, may remove during dressing, bathing, and walk to/from bathroom; may remove when in bed Restrictions Weight Bearing Restrictions: No     Mobility  Bed Mobility Overal bed mobility: Needs Assistance Bed Mobility: Rolling, Sidelying to Sit, Supine to Sit Rolling: Min assist, Mod assist Sidelying to sit: Max assist Supine to sit: Max assist     General bed mobility comments: pt continues to require extensive assistance to exit bed due to pain and strength deficits    Transfers Overall transfer level: Needs assistance Equipment used: Rolling walker (2 wheels) Transfers: Sit to/from Stand Sit to Stand: Min assist, From elevated surface           General transfer comment: pt was able to stand from elevated bed height however does continue to require min assist to safely achieve standing. Pt is extremely slow moving    Ambulation/Gait Ambulation/Gait assistance: Min guard, Min assist Gait Distance (Feet): 5 Feet Assistive device: Rolling walker (2 wheels) Gait Pattern/deviations: Antalgic, Step-to pattern Gait velocity: extremely decreased     General Gait Details: Pt has extremely slow, antalgic gait. Distance was limited by pain and pt having visitors  present. Will progress and advance gait distances in PM session.    Balance Overall balance assessment: Needs assistance Sitting-balance  support: Single extremity supported, Feet supported Sitting balance-Leahy Scale: Fair Sitting balance - Comments: pt was able to maintain sitting balance with BUE support with close supervision however pt has LOB in sitting without UE support   Standing balance support: Bilateral upper extremity supported, During functional activity Standing balance-Leahy Scale: Poor Standing balance comment: pt has extensive history of falls. High fall risk       Cognition Arousal/Alertness: Awake/alert Behavior During Therapy: WFL for tasks assessed/performed Overall Cognitive Status: Within Functional Limits for tasks assessed      General Comments: Pt is A and O x 4 but severely limited and slow moving due to pain           General Comments General comments (skin integrity, edema, etc.): Session greatly limited by pain and visitors arriving. Will have more extensive session this PM.      Pertinent Vitals/Pain Pain Assessment Pain Assessment: 0-10 Pain Score: 8  Pain Location: back at rest Pain Descriptors / Indicators: Aching Pain Intervention(s): Limited activity within patient's tolerance, Monitored during session, Premedicated before session, Repositioned     PT Goals (current goals can now be found in the care plan section) Acute Rehab PT Goals Patient Stated Goal: get better Progress towards PT goals: Progressing toward goals    Frequency    BID      PT Plan Discharge plan needs to be updated       AM-PAC PT "6 Clicks" Mobility   Outcome Measure  Help needed turning from your back to your side while in a flat bed without using bedrails?: A Lot Help needed moving from lying on your back to sitting on the side of a flat bed without using bedrails?: A Lot Help needed moving to and from a bed to a chair (including a wheelchair)?: A Lot Help needed standing up from a chair using your arms (e.g., wheelchair or bedside chair)?: A Lot Help needed to walk in hospital room?:  None Help needed climbing 3-5 steps with a railing? : A Lot 6 Click Score: 14    End of Session Equipment Utilized During Treatment: Gait belt Activity Tolerance: Patient limited by pain Patient left: in chair;with call bell/phone within reach;with chair alarm set;with nursing/sitter in room;with family/visitor present Nurse Communication: Mobility status;Precautions;Other (comment) PT Visit Diagnosis: Other abnormalities of gait and mobility (R26.89);Muscle weakness (generalized) (M62.81);History of falling (Z91.81);Repeated falls (R29.6);Pain     Time: 1010-1033 PT Time Calculation (min) (ACUTE ONLY): 23 min  Charges:  $Therapeutic Activity: 23-37 mins                     Julaine Fusi PTA 05/10/21, 12:06 PM

## 2021-05-10 NOTE — Progress Notes (Signed)
° °   Attending Progress Note  History: Eddie Cowan is s/p L2-S2 PSF with L2-3 and L5-S1 decompression.   POD2: some headache and back pain overnight. Controlled with medications POD1: NAEO  Physical Exam: Vitals:   05/10/21 0235 05/10/21 0742  BP: 122/61 114/67  Pulse: 89 90  Resp: 18 18  Temp: 99.7 F (37.6 C) 99.3 F (37.4 C)  SpO2: 95% 94%    AA Ox3 CNI  Strength:5/5 throughout BLE  Incision covered with wound vac HV #1 150 HV#2 90  Data:  Recent Labs  Lab 05/09/21 0634  CREATININE 0.74    No results for input(s): AST, ALT, ALKPHOS in the last 168 hours.  Invalid input(s): TBILI   Recent Labs  Lab 05/09/21 0634  WBC 11.6*  HGB 10.1*  HCT 31.4*  PLT 140*    No results for input(s): APTT, INR in the last 168 hours.       Other tests/results: cultures NTD  Assessment/Plan:  Eddie Cowan is a 66 y.o s/p L2-S2 PSF.   - mobilize - pain control - DVT prophylaxis - PTOT - will keep HV drains for now. Will continue to monitor output.  Manning Charity  Department of Neurosurgery

## 2021-05-10 NOTE — Plan of Care (Signed)
Patient sleeping between care Aox4. PRN dilaudid and oxycodone given multiple times overnight. Headache present. Surgical dressing C/D/I. Hemovacs intact. Wife at bedside. Call bell within reach.  PLAN OF CARE ONGOING Problem: Education: Goal: Ability to verbalize activity precautions or restrictions will improve Outcome: Progressing Goal: Knowledge of the prescribed therapeutic regimen will improve Outcome: Progressing Goal: Understanding of discharge needs will improve Outcome: Progressing   Problem: Activity: Goal: Ability to avoid complications of mobility impairment will improve Outcome: Progressing Goal: Ability to tolerate increased activity will improve Outcome: Progressing Goal: Will remain free from falls Outcome: Progressing   Problem: Bowel/Gastric: Goal: Gastrointestinal status for postoperative course will improve Outcome: Progressing   Problem: Clinical Measurements: Goal: Ability to maintain clinical measurements within normal limits will improve Outcome: Progressing Goal: Postoperative complications will be avoided or minimized Outcome: Progressing Goal: Diagnostic test results will improve Outcome: Progressing   Problem: Pain Management: Goal: Pain level will decrease Outcome: Progressing   Problem: Skin Integrity: Goal: Will show signs of wound healing Outcome: Progressing   Problem: Health Behavior/Discharge Planning: Goal: Identification of resources available to assist in meeting health care needs will improve Outcome: Progressing   Problem: Bladder/Genitourinary: Goal: Urinary functional status for postoperative course will improve Outcome: Progressing

## 2021-05-11 LAB — TROPONIN I (HIGH SENSITIVITY)
Troponin I (High Sensitivity): 3 ng/L (ref <18)
Troponin I (High Sensitivity): 3 ng/L (ref <18)

## 2021-05-11 LAB — SURGICAL PATHOLOGY

## 2021-05-11 MED ORDER — ACETAMINOPHEN 325 MG PO TABS: 650.0000 mg | ORAL_TABLET | Freq: Four times a day (QID) | ORAL | Status: DC | PRN

## 2021-05-11 NOTE — Progress Notes (Signed)
Physical Therapy Treatment Patient Details Name: Eddie Cowan MRN: [id number] DOB: [birth year] Today's Date: 05/11/2021   History of Present Illness Pt is a 66 y.o. male with c/o difficulty with walking, changes in his posture, and R leg pain; has S1 fx.  H/o L3-5 PSF August 2021.  05/08/21 pt s/p ORIF sacral fx, posterolateral arthrodesis L2 to S2, posterior segmental instrumentation L2 to S2, and lumbar decompression L2-3 and L5-S1.  PMH includes MS, numbness/tingling L>R hands, c-spine surgery, B femur repair, R shoulder dislocation repair R 2008, sleep apnea with CPAP, htn, h/o DVT, anxiety, migraines, htn, and shingles.    PT Comments    Pt was in recliner upon arriving. He is agreeable to session and endorsing feeling better overall today. He was able to safely stand, ambulate and do stairs with supervision. Author will return for 2nd session later this date but overall pt is doing well. Recommend HHPT at DC.   Recommendations for follow up therapy are one component of a multi-disciplinary discharge planning process, led by the attending physician.  Recommendations may be updated based on patient status, additional functional criteria and insurance authorization.  Follow Up Recommendations  Home health PT     Assistance Recommended at Discharge Frequent or constant Supervision/Assistance  Patient can return home with the following A little help with walking and/or transfers;A little help with bathing/dressing/bathroom;Assistance with cooking/housework;Assist for transportation;Help with stairs or ramp for entrance   Equipment Recommendations  Rolling walker (2 wheels);BSC/3in1 (if pt does not already have one)       Precautions / Restrictions Precautions Precautions: Fall;Back Required Braces or Orthoses: Spinal Brace Spinal Brace: Lumbar corset;Applied in sitting position;Other (comment) Spinal Brace Comments: Per orders, may remove during dressing, bathing, and walk to/from  bathroom; may remove when in bed Restrictions Weight Bearing Restrictions: No     Mobility  Bed Mobility  General bed mobility comments: in recliner pre/post session. sleeps in recliner at baseline    Transfers Overall transfer level: Needs assistance Equipment used: Rolling walker (2 wheels) Transfers: Sit to/from Stand Sit to Stand: Supervision           General transfer comment: Pt was able to safely stand and sit from recliner. increased time due to pain however was able to perform.    Ambulation/Gait Ambulation/Gait assistance: Supervision Gait Distance (Feet): 200 Feet Assistive device: Rolling walker (2 wheels) Gait Pattern/deviations: Step-through pattern, Antalgic Gait velocity: decreased     General Gait Details: pt was able to ambulate 200 ft without physical assistance or LOB. Vcs for posture correction   Stairs Stairs: Yes Stairs assistance: Supervision Stair Management: One rail Right, Step to pattern, Forwards Number of Stairs: 4 General stair comments: pt was able to safely ascend/descend 4 stair without LOB or safety concern.    Balance Overall balance assessment: Needs assistance Sitting-balance support: Feet supported, Bilateral upper extremity supported Sitting balance-Leahy Scale: Fair     Standing balance support: During functional activity, Bilateral upper extremity supported, Reliant on assistive device for balance Standing balance-Leahy Scale: Fair       Cognition Arousal/Alertness: Awake/alert Behavior During Therapy: WFL for tasks assessed/performed Overall Cognitive Status: Within Functional Limits for tasks assessed      General Comments: Pt is A and O x 4. Supportive spouse present throughout.               Pertinent Vitals/Pain Pain Assessment Pain Assessment: 0-10 Pain Score: 7  Pain Location: LBP Pain Descriptors / Indicators: Aching Pain Intervention(s): Limited activity  within patient's tolerance, Monitored  during session, Premedicated before session, Repositioned     PT Goals (current goals can now be found in the care plan section) Acute Rehab PT Goals Patient Stated Goal: Go home with the right pain medications Progress towards PT goals: Progressing toward goals    Frequency    BID      PT Plan Current plan remains appropriate       AM-PAC PT "6 Clicks" Mobility   Outcome Measure  Help needed turning from your back to your side while in a flat bed without using bedrails?: A Little Help needed moving from lying on your back to sitting on the side of a flat bed without using bedrails?: A Little Help needed moving to and from a bed to a chair (including a wheelchair)?: A Little Help needed standing up from a chair using your arms (e.g., wheelchair or bedside chair)?: A Little Help needed to walk in hospital room?: None Help needed climbing 3-5 steps with a railing? : A Little 6 Click Score: 19    End of Session Equipment Utilized During Treatment: Gait belt Activity Tolerance: Patient tolerated treatment well Patient left: in chair;with call bell/phone within reach;with chair alarm set;with nursing/sitter in room;with family/visitor present Nurse Communication: Mobility status;Precautions;Other (comment) PT Visit Diagnosis: Other abnormalities of gait and mobility (R26.89);Muscle weakness (generalized) (M62.81);History of falling (Z91.81);Repeated falls (R29.6);Pain     Time: 1120-1146 PT Time Calculation (min) (ACUTE ONLY): 26 min  Charges:  $Gait Training: 23-37 mins                     Julaine Fusi PTA 05/11/21, 12:03 PM

## 2021-05-11 NOTE — TOC Progression Note (Signed)
Transition of Care [facility]) - Progression Note    Patient Details  Name: Eddie Cowan MRN: [id number] Date of Birth: [birth year]  Transition of Care [facility]) CM/SW Contact  Marlowe Sax, RN Phone Number: 05/11/2021, 4:08 PM  Clinical Narrative:   The patient called back and stated that he prefers me to find another company for [facility] that [facility] is being difficult, I reached out to [facility] and they accepted the patient, I notified the patient        Expected Discharge Plan and Services                                                 Social Determinants of Health (SDOH) Interventions    Readmission Risk Interventions No flowsheet data found.

## 2021-05-11 NOTE — Care Management Important Message (Signed)
Important Message  Patient Details  Name: Eddie Cowan MRN: [id number] Date of Birth: [birth year]   Medicare Important Message Given:  Yes     Johnell Comings 05/11/2021, 3:21 PM

## 2021-05-11 NOTE — Plan of Care (Signed)

## 2021-05-11 NOTE — TOC Progression Note (Addendum)
Transition of Care [facility]) - Progression Note    Patient Details  Name: Eddie Cowan MRN: [id number] Date of Birth: 05-23-1955  Transition of Care [facility]) CM/SW Contact  Marlowe Sax, RN Phone Number: 05/11/2021, 1:18 PM  Clinical Narrative:   [facility] home [facility] called and stated that the patient is not in the Service area and they can not accept him, I reached out to the patient and he stated that [facility] has seen him 3 other times, He will call them personally, I offered to set him up with another [facility] agency and he declined, he feels gthat once he speaks to [facility] they will see him          Expected Discharge Plan and Services                                                 Social Determinants of Health (SDOH) Interventions    Readmission Risk Interventions No flowsheet data found.

## 2021-05-11 NOTE — Progress Notes (Signed)
° °   Attending Progress Note  History: Eddie Cowan is s/p L2-S2 PSF with L2-3 and L5-S1 decompression.   POD3: reports improved pain overnight  POD2: some headache and back pain overnight. Controlled with medications POD1: NAEO  Physical Exam: Vitals:   05/11/21 0359 05/11/21 0729  BP: 128/64 123/64  Pulse: 88 73  Resp: 16 18  Temp: 99.1 F (37.3 C) 99.6 F (37.6 C)  SpO2: 96% 97%    AA Ox3 CNI  Strength:5/5 throughout BLE  Incision covered with wound vac HV #1 0 HV#2 0  Data:  Recent Labs  Lab 05/09/21 0634  CREATININE 0.74    No results for input(s): AST, ALT, ALKPHOS in the last 168 hours.  Invalid input(s): TBILI   Recent Labs  Lab 05/09/21 0634  WBC 11.6*  HGB 10.1*  HCT 31.4*  PLT 140*    No results for input(s): APTT, INR in the last 168 hours.       Other tests/results: cultures NTD  Assessment/Plan:  Eddie Cowan is a 66 y.o s/p L2-S2 PSF.   - mobilize - pain control - DVT prophylaxis - PTOT - will likely d/c drains this afternoon - dispo planning underway  Manning Charity  Department of Neurosurgery

## 2021-05-12 ENCOUNTER — Inpatient Hospital Stay: Payer: Medicare Other

## 2021-05-12 ENCOUNTER — Encounter: Payer: Self-pay | Admitting: Neurosurgery

## 2021-05-12 LAB — BASIC METABOLIC PANEL
Anion gap: 5 (ref 5–15)
BUN: 10 mg/dL (ref 8–23)
CO2: 30 mmol/L (ref 22–32)
Calcium: 8.3 mg/dL — ABNORMAL LOW (ref 8.9–10.3)
Chloride: 95 mmol/L — ABNORMAL LOW (ref 98–111)
Creatinine, Ser: 0.73 mg/dL (ref 0.61–1.24)
GFR, Estimated: >60 mL/min (ref >60)
Glucose, Bld: 121 mg/dL — ABNORMAL HIGH (ref 70–99)
Potassium: 3.9 mmol/L (ref 3.5–5.1)
Sodium: 130 mmol/L — ABNORMAL LOW (ref 135–145)

## 2021-05-12 LAB — TROPONIN I (HIGH SENSITIVITY)
Troponin I (High Sensitivity): 3 ng/L (ref <18)
Troponin I (High Sensitivity): 3 ng/L (ref <18)

## 2021-05-12 IMAGING — CT CT ANGIO CHEST
2 of 7 series · 17 of 46 positions shown · IV contrast (APPLIED)
Comparison: None.

CLINICAL DATA: Chest pain and shortness of breath

EXAM:
CT ANGIOGRAPHY CHEST WITH CONTRAST
TECHNIQUE: Multidetector CT imaging of the chest was performed using the
standard protocol during bolus administration of intravenous
contrast. Multiplanar CT image reconstructions and MIPs were
obtained to evaluate the vascular anatomy.

[Series 6: thins · axial · 0.74mm/px · z∈[-367,-134]mm · 14 of 325 slices shown]
[im 17/325  lung]
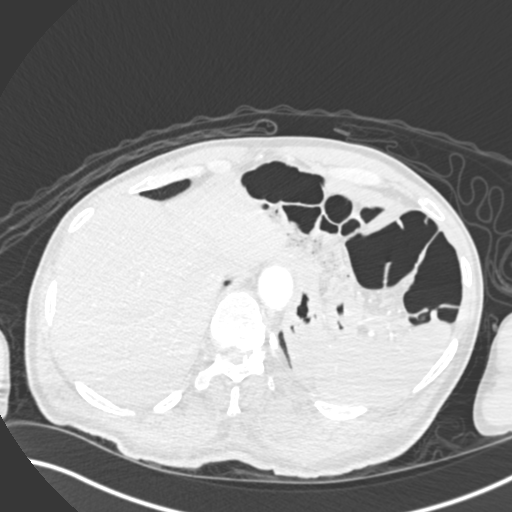
[im 49/325  soft-tissue]
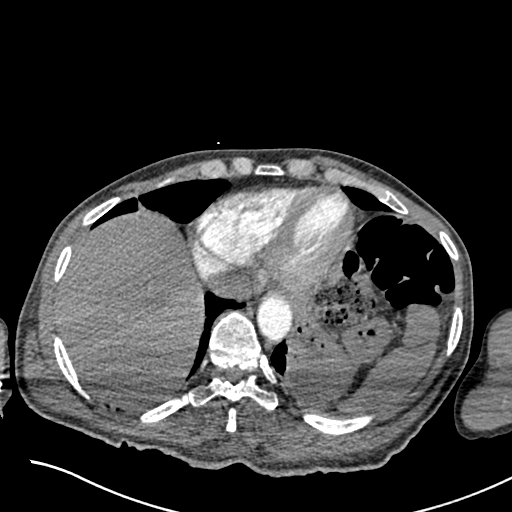
[im 65/325  lung]
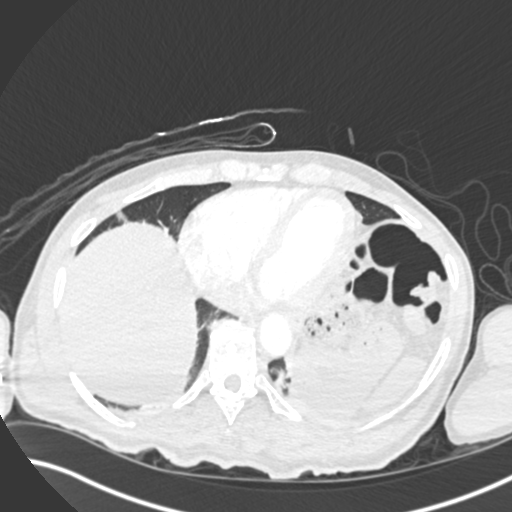
[im 82/325  soft-tissue]
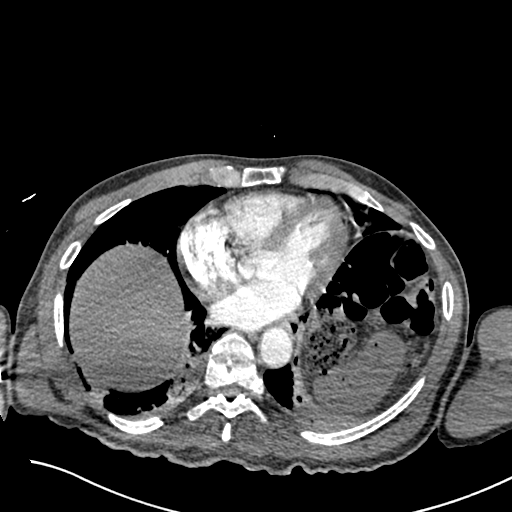
[im 114/325  lung]
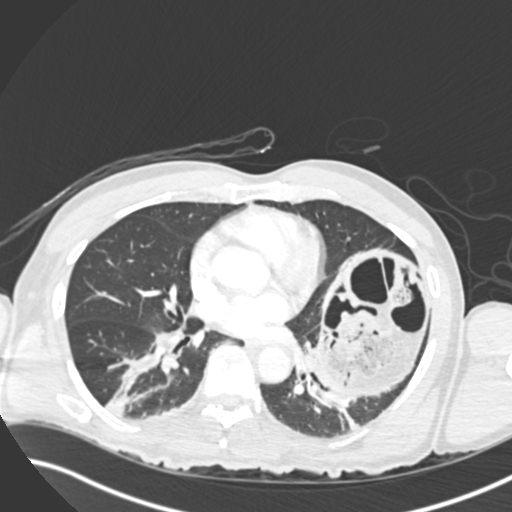
[im 130/325  soft-tissue]
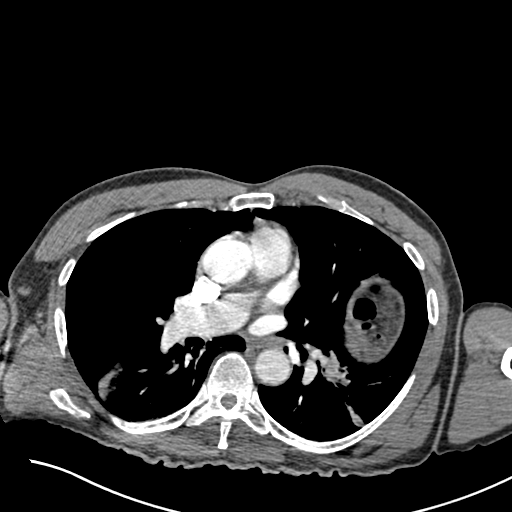
[im 146/325  lung]
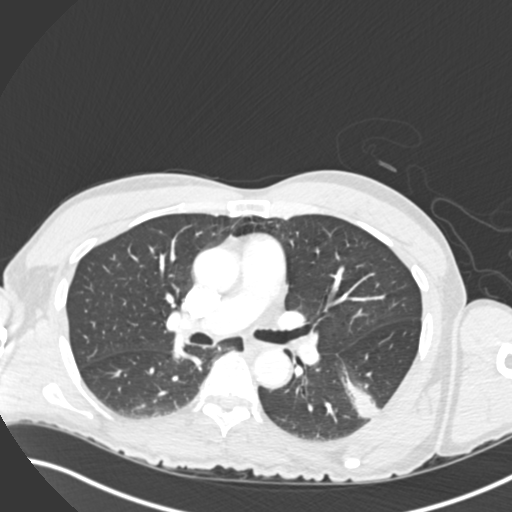
[im 179/325  soft-tissue]
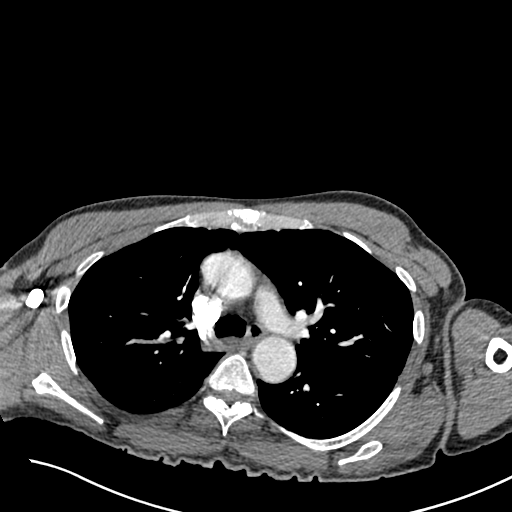
[im 195/325  lung]
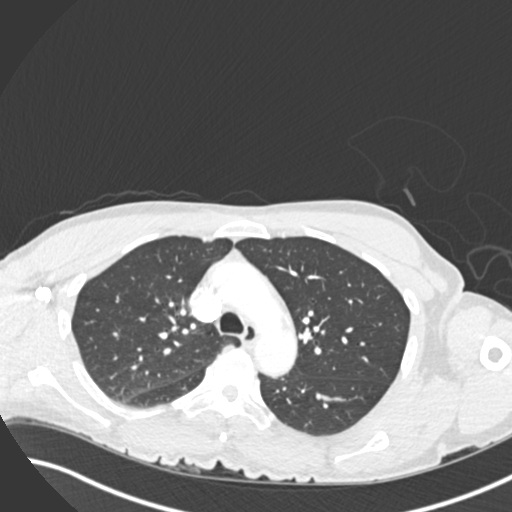
[im 211/325  soft-tissue]
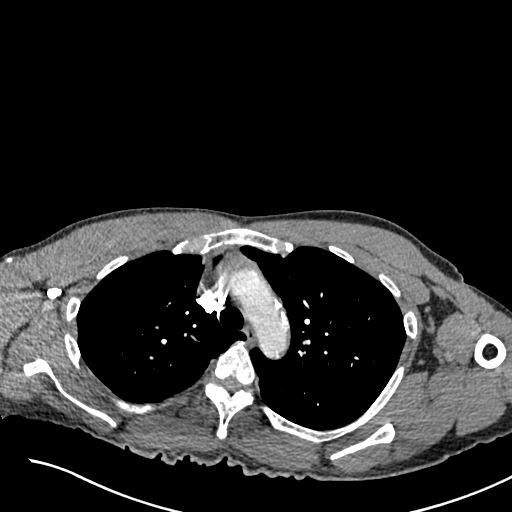
[im 244/325  lung]
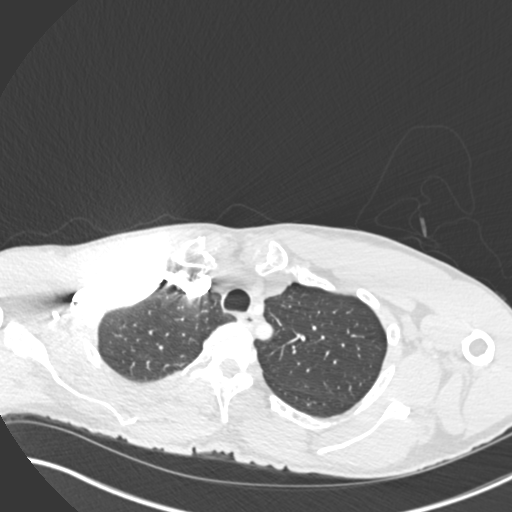
[im 260/325  soft-tissue]
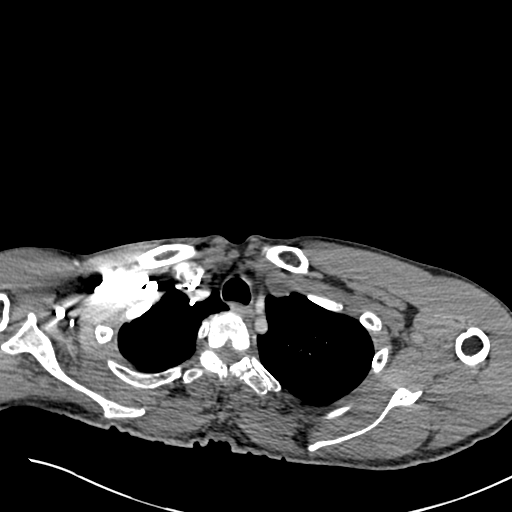
[im 276/325  lung]
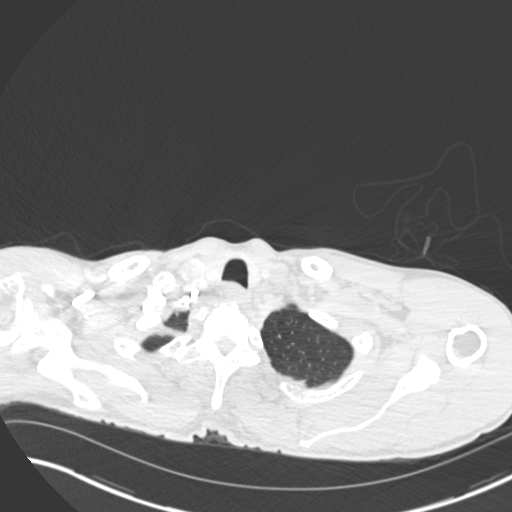
[im 308/325  soft-tissue]
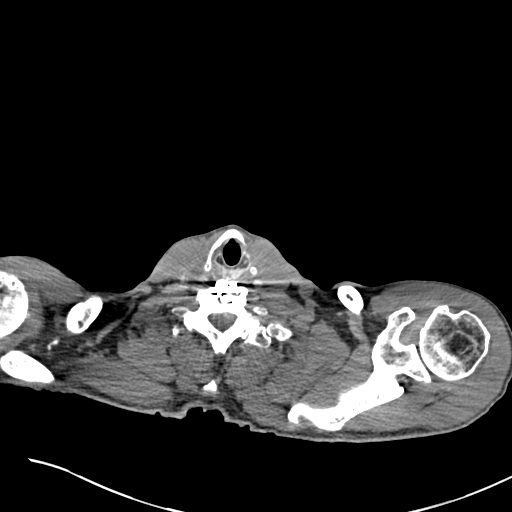

[Series 8: coronal mpr · coronal · 0.46mm/px · 3 of 106 slices shown]
[im 27/106  soft-tissue]
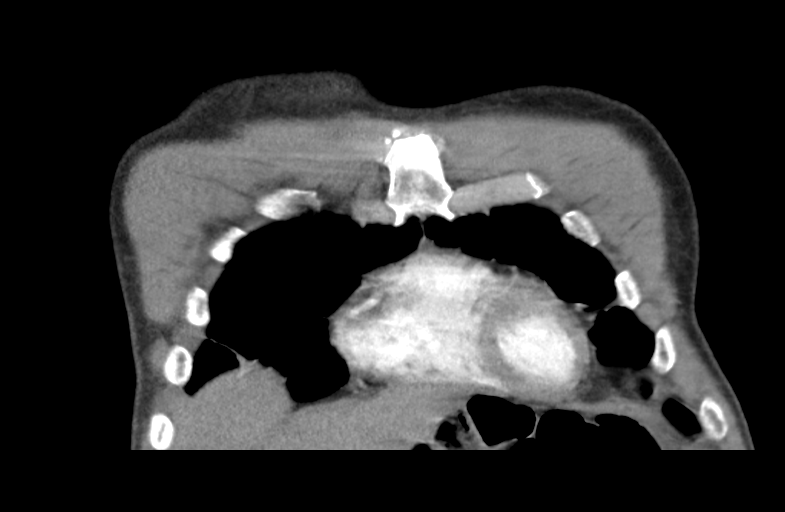
[im 53/106  soft-tissue]
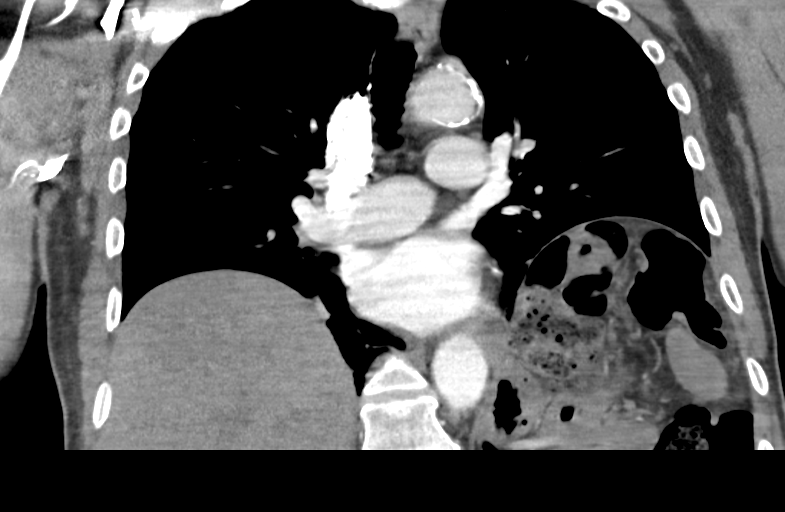
[im 79/106  soft-tissue]
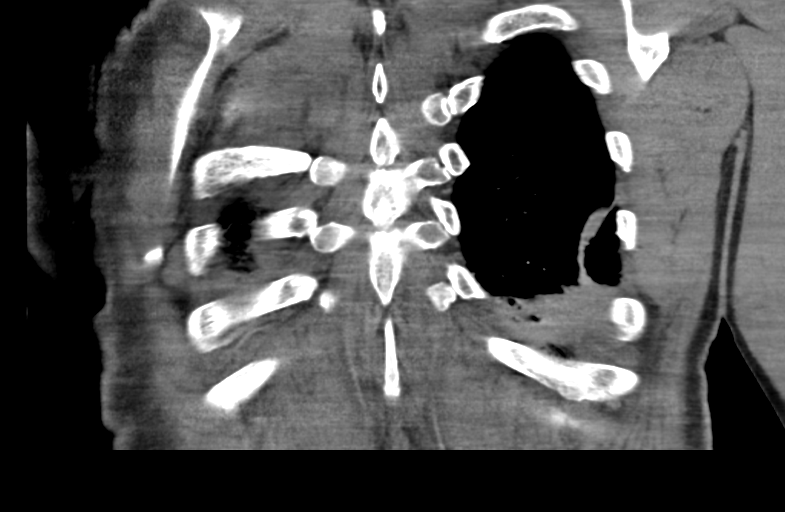

[17 of 46 positions shown; findings below may reference images not displayed]

RADIATION DOSE REDUCTION: This exam was performed according to the
departmental dose-optimization program which includes automated
exposure control, adjustment of the mA and/or kV according to
patient size and/or use of iterative reconstruction technique.

CONTRAST:  75mL OMNIPAQUE IOHEXOL 350 MG/ML SOLN
FINDINGS: Cardiovascular: The amount of opacification in the pulmonary
vasculature and systemic vasculature is similar, which is suboptimal
for pulmonary arterial assessment but the degree of pulmonary
arterial opacification is deemed adequate. There may be reduced
sensitivity for subsegmental emboli. No pulmonary embolus is
identified.

Atherosclerotic calcification of the aortic arch is present.

Mediastinum/Nodes: Unremarkable

Lungs/Pleura: There is atelectasis particularly in the lower lobes
near the diaphragms, and a lesser degree of atelectasis in the
lingula and right middle lobe.

Upper Abdomen: Prominence of stool in the upper abdominal colon.

Musculoskeletal: Lower cervical plate and screw fixator. Mild
thoracic kyphosis.

Review of the MIP images confirms the above findings.
IMPRESSION: 1. Atelectasis in both lower lobes and to a lesser extent in the
right middle lobe and lingula.
2. No embolus identified. Reduced sensitivity for subsegmental
emboli due to suboptimal contrast bolus timing.
3.  Aortic Atherosclerosis ([JP]-[JP]).
4. Prominence of stool in the upper abdominal colon, possible
constipation.

## 2021-05-12 MED ORDER — LACTULOSE 10 GM/15ML PO SOLN
20.0000 g | Freq: Once | ORAL | Status: AC
  Administered 2021-05-12: 20 g via ORAL
  Filled 2021-05-12: qty 30

## 2021-05-12 MED ORDER — BISACODYL 5 MG PO TBEC
5.0000 mg | DELAYED_RELEASE_TABLET | Freq: Once | ORAL | Status: AC
  Administered 2021-05-12: 5 mg via ORAL
  Filled 2021-05-12: qty 1

## 2021-05-12 MED ORDER — SORBITOL 70 % SOLN
300.0000 mL | TOPICAL_OIL | Freq: Once | ORAL | Status: AC
  Administered 2021-05-12: 300 mL via RECTAL
  Filled 2021-05-12: qty 90

## 2021-05-12 MED ORDER — IOHEXOL 350 MG/ML SOLN
75.0000 mL | Freq: Once | INTRAVENOUS | Status: AC | PRN
  Administered 2021-05-12: 75 mL via INTRAVENOUS

## 2021-05-12 NOTE — Progress Notes (Signed)
Patient reports L sided, sharp chest pain. He states pain ranges from 4/10 to 7/10. He also reports pain is worse with deep breaths. Vital signs obtained. Dr. Myer Haff made aware. Troponin and EKG ordered.   05/12/21 0516  Vitals  Temp 99.4 F (37.4 C)  Temp Source Oral  BP 134/70  MAP (mmHg) 88  BP Location Left Arm  BP Method Automatic  Patient Position (if appropriate) Sitting  Pulse Rate 81  Pulse Rate Source Monitor  Resp (!) 21  MEWS COLOR  MEWS Score Color Green  Oxygen Therapy  SpO2 95 %  O2 Device Room Air  MEWS Score  MEWS Temp 0  MEWS Systolic 0  MEWS Pulse 0  MEWS RR 1  MEWS LOC 0  MEWS Score 1

## 2021-05-12 NOTE — Discharge Instructions (Signed)
Your surgeon has performed an operation on your lumbar spine (low back) to relieve pressure on one or more nerves. Many times, patients feel better immediately after surgery and can overdo it. Even if you feel well, it is important that you follow these activity guidelines. If you do not let your back heal properly from the surgery, you can increase the chance of a return of your symptoms. The following are instructions to help in your recovery once you have been discharged from the hospital.  * Do not take anti-inflammatory medications for 3 months after surgery (naproxen [Aleve], ibuprofen [Advil, Motrin],  etc.) *Please resume home Aspirin *Please hold home Eliquis for 14 days post-op   Activity    No bending, lifting, or twisting (BLT). Avoid lifting objects heavier than 10 pounds (gallon milk jug).  Where possible, avoid household activities that involve lifting, bending, pushing, or pulling such as laundry, vacuuming, grocery shopping, and childcare. Try to arrange for help from friends and family for these activities while your back heals.  Increase physical activity slowly as tolerated.  Taking short walks is encouraged, but avoid strenuous exercise. Do not jog, run, bicycle, lift weights, or participate in any other exercises unless specifically allowed by your doctor. Avoid prolonged sitting, including car rides.  Talk to your doctor before resuming sexual activity.  You should not drive until cleared by your doctor.  Until released by your doctor, you should not return to work or school.  You should rest at home and let your body heal.   You may shower two days after your surgery.  After showering, lightly dab your incision dry. Do not take a tub bath or go swimming for 3 weeks, or until approved by your doctor at your follow-up appointment.  If you smoke, we strongly recommend that you quit.  Smoking has been proven to interfere with normal healing in your back and will  dramatically reduce the success rate of your surgery. Please contact [facility] (800-QUIT-NOW) and use the resources at www.[facility].com for assistance in stopping smoking.  Surgical Incision   Keep your incision area clean and dry.  If you have staples or stitches on your incision, you should have a follow up scheduled for removal. If you do not have staples or stitches, you will have steri-strips (small pieces of surgical tape) or Dermabond glue. The steri-strips/glue should begin to peel away within about a week (it is fine if the steri-strips fall off before then). If the strips are still in place one week after your surgery, you may gently remove them.  Diet            You may return to your usual diet. Be sure to stay hydrated.  When to Contact us  Although your surgery and recovery will likely be uneventful, you may have some residual numbness, aches, and pains in your back and/or legs. This is normal and should improve in the next few weeks.  However, should you experience any of the following, contact us immediately: New numbness or weakness Pain that is progressively getting worse, and is not relieved by your pain medications or rest Bleeding, redness, swelling, pain, or drainage from surgical incision Chills or flu-like symptoms Fever greater than 101.0 F (38.3 C) Problems with bowel or bladder functions Difficulty breathing or shortness of breath Warmth, tenderness, or swelling in your calf  Contact Information During office hours (Monday-Friday 9 am to 5 pm), please call your physician at [phone] After hours and weekends, please call  [phone] and speak with the answering service, who will contact the doctor on call.  If that fails, call the [facility] Operator at [phone] and ask for the Neurosurgery Resident On Call  For a life-threatening emergency, call 911

## 2021-05-12 NOTE — Progress Notes (Signed)
° °   Attending Progress Note  History: Eddie Cowan is s/p L2-S2 PSF with L2-3 and L5-S1 decompression.   POD4: Constipation and chest pain overnight.  Cardiac work-up negative thus far. POD3: reports improved pain overnight  POD2: some headache and back pain overnight. Controlled with medications POD1: NAEO  Physical Exam: Vitals:   05/12/21 0516 05/12/21 0728  BP: 134/70 122/63  Pulse: 81 76  Resp: (!) 21 14  Temp: 99.4 F (37.4 C) 98.6 F (37 C)  SpO2: 95% 97%    AA Ox3 CNI  Strength:5/5 throughout BLE  Incision covered with clean post-op bandage  Data:  Recent Labs  Lab 05/09/21 0634  CREATININE 0.74    No results for input(s): AST, ALT, ALKPHOS in the last 168 hours.  Invalid input(s): TBILI   Recent Labs  Lab 05/09/21 0634  WBC 11.6*  HGB 10.1*  HCT 31.4*  PLT 140*    No results for input(s): APTT, INR in the last 168 hours.       Other tests/results: cultures NTD.  Troponins negative x2  Assessment/Plan:  Eddie Cowan is a 66 y.o s/p L2-S2 PSF.   - mobilize - pain control - DVT prophylaxis - PTOT - dispo planning underway  Cooper Render  Department of Neurosurgery

## 2021-05-12 NOTE — Progress Notes (Signed)
Physical Therapy Treatment Patient Details Name: Eddie Cowan MRN: [id number] DOB: 11-01-1955 Today's Date: 05/12/2021   History of Present Illness Pt is a 66 y.o. male with c/o difficulty with walking, changes in his posture, and R leg pain; has S1 fx.  H/o L3-5 PSF August 2021.  05/08/21 pt s/p ORIF sacral fx, posterolateral arthrodesis L2 to S2, posterior segmental instrumentation L2 to S2, and lumbar decompression L2-3 and L5-S1.  PMH includes MS, numbness/tingling L>R hands, c-spine surgery, B femur repair, R shoulder dislocation repair R 2008, sleep apnea with CPAP, htn, h/o DVT, anxiety, migraines, htn, and shingles.    PT Comments    Pt seen for BID/PM session. He was sitting in recliner upon arriving (Usually sleeps in recliner at baseline). Was easily able to stand and ambulate with supervision only. PA arrived during session and drain x 1 removed however planning for 2nd drain removal tomorrow if appropriate. Pt overall continues to progress well. He will benefit from HHPT at DC to maximize independence with all ADLs.    Recommendations for follow up therapy are one component of a multi-disciplinary discharge planning process, led by the attending physician.  Recommendations may be updated based on patient status, additional functional criteria and insurance authorization.  Follow Up Recommendations  Home health PT     Assistance Recommended at Discharge Frequent or constant Supervision/Assistance  Patient can return home with the following A little help with walking and/or transfers;A little help with bathing/dressing/bathroom;Assistance with cooking/housework;Assist for transportation;Help with stairs or ramp for entrance   Equipment Recommendations  Rolling walker (2 wheels);BSC/3in1       Precautions / Restrictions Precautions Precautions: Fall;Back Required Braces or Orthoses: Spinal Brace Spinal Brace: Lumbar corset Spinal Brace Comments: Per orders, may remove during  dressing, bathing, and walk to/from bathroom; may remove when in bed Restrictions Weight Bearing Restrictions: No     Mobility  Bed Mobility      General bed mobility comments: In recliner pre/post session. sleeeps in recliner at baseline.    Transfers Overall transfer level: Needs assistance Equipment used: Rolling walker (2 wheels) Transfers: Sit to/from Stand Sit to Stand: Supervision           General transfer comment: Increased time required to perform all transfers and ambulation however no physival assistance required. Slow moving due to pain    Ambulation/Gait Ambulation/Gait assistance: Supervision   Assistive device: Rolling walker (2 wheels) Gait Pattern/deviations: Step-through pattern, Antalgic Gait velocity: decreased     General Gait Details: distance limited by PA arriving to remove pt's drain x 1. plan for 2nd drain removal tomorrow.     Balance Overall balance assessment: Needs assistance Sitting-balance support: Feet supported, Bilateral upper extremity supported Sitting balance-Leahy Scale: Good     Standing balance support: During functional activity, Bilateral upper extremity supported, Reliant on assistive device for balance Standing balance-Leahy Scale: Good       Cognition Arousal/Alertness: Awake/alert Behavior During Therapy: WFL for tasks assessed/performed Overall Cognitive Status: Within Functional Limits for tasks assessed        General Comments: Pt is A and O x 4. Endorsing feeling better this date overall               Pertinent Vitals/Pain Pain Assessment Pain Location: LBP Pain Descriptors / Indicators: Discomfort     PT Goals (current goals can now be found in the care plan section) Acute Rehab PT Goals Patient Stated Goal: go home and move more pain free    Frequency  BID      PT Plan Current plan remains appropriate       AM-PAC PT "6 Clicks" Mobility   Outcome Measure  Help needed turning  from your back to your side while in a flat bed without using bedrails?: A Little Help needed moving from lying on your back to sitting on the side of a flat bed without using bedrails?: A Little Help needed moving to and from a bed to a chair (including a wheelchair)?: A Little Help needed standing up from a chair using your arms (e.g., wheelchair or bedside chair)?: A Little Help needed to walk in hospital room?: A Little Help needed climbing 3-5 steps with a railing? : A Little 6 Click Score: 18    End of Session   Activity Tolerance: Patient tolerated treatment well Patient left: in chair;with call bell/phone within reach;with chair alarm set;with nursing/sitter in room;with family/visitor present Nurse Communication: Mobility status;Precautions;Other (comment) PT Visit Diagnosis: Other abnormalities of gait and mobility (R26.89);Muscle weakness (generalized) (M62.81);History of falling (Z91.81);Repeated falls (R29.6);Pain     Time:  -     Charges:                        Jetta Lout PTA 05/12/21, 6:15 AM

## 2021-05-12 NOTE — Progress Notes (Signed)
Physical Therapy Treatment Patient Details Name: Eddie Cowan MRN: [id number] DOB: 04/24/1955 Today's Date: 05/12/2021   History of Present Illness Pt is a 66 y.o. male with c/o difficulty with walking, changes in his posture, and R leg pain; has S1 fx.  H/o L3-5 PSF August 2021.  05/08/21 pt s/p ORIF sacral fx, posterolateral arthrodesis L2 to S2, posterior segmental instrumentation L2 to S2, and lumbar decompression L2-3 and L5-S1.  PMH includes MS, numbness/tingling L>R hands, c-spine surgery, B femur repair, R shoulder dislocation repair R 2008, sleep apnea with CPAP, htn, h/o DVT, anxiety, migraines, htn, and shingles.    PT Comments    Pt was sitting on BSC upon arriving. Unsuccessful BM however RN reports about to progress to enema. Pt agrees to ambulation prior. He was safely and easily able to ambulate 200 ft without LOB or safety concern. Pt is cleared form an acute PT standpoint to safely DC home with HHPT.   Recommendations for follow up therapy are one component of a multi-disciplinary discharge planning process, led by the attending physician.  Recommendations may be updated based on patient status, additional functional criteria and insurance authorization.  Follow Up Recommendations  Home health PT     Assistance Recommended at Discharge Frequent or constant Supervision/Assistance  Patient can return home with the following A little help with walking and/or transfers;A little help with bathing/dressing/bathroom;Assistance with cooking/housework;Assist for transportation;Help with stairs or ramp for entrance   Equipment Recommendations  Rolling walker (2 wheels);BSC/3in1       Precautions / Restrictions Precautions Precautions: Fall;Back Precaution Booklet Issued: Yes (comment) Precaution Comments: 2 hemovacs; 1 wound vac Required Braces or Orthoses: Spinal Brace Spinal Brace: Lumbar corset Restrictions Weight Bearing Restrictions: No     Mobility  Bed Mobility     General bed mobility comments: Pt was on BSC upon arriving. Seated EOB post session    Transfers Overall transfer level: Needs assistance Equipment used: Rolling walker (2 wheels) Transfers: Sit to/from Stand Sit to Stand: Supervision      General transfer comment: Pt was able to STS form BSC and from EOB with no physical assistance.    Ambulation/Gait Ambulation/Gait assistance: Supervision   Assistive device: Rolling walker (2 wheels) Gait Pattern/deviations: Step-through pattern, Antalgic Gait velocity: decreased     General Gait Details: Pt was able to ambulate 200 ft with RW without LOB or safety concern    Balance Overall balance assessment: Needs assistance Sitting-balance support: Feet supported, Bilateral upper extremity supported Sitting balance-Leahy Scale: Good     Standing balance support: During functional activity, Bilateral upper extremity supported, Reliant on assistive device for balance Standing balance-Leahy Scale: Good       Cognition Arousal/Alertness: Awake/alert Behavior During Therapy: WFL for tasks assessed/performed Overall Cognitive Status: Within Functional Limits for tasks assessed      General Comments: Pt was is A and O x 4               Pertinent Vitals/Pain Pain Assessment Pain Assessment: No/denies pain Pain Location: LBP Pain Descriptors / Indicators: Discomfort     PT Goals (current goals can now be found in the care plan section) Acute Rehab PT Goals Patient Stated Goal: go home Progress towards PT goals: Progressing toward goals    Frequency    BID      PT Plan Current plan remains appropriate       AM-PAC PT "6 Clicks" Mobility   Outcome Measure  Help needed turning from your back to  your side while in a flat bed without using bedrails?: A Little Help needed moving from lying on your back to sitting on the side of a flat bed without using bedrails?: A Little Help needed moving to and from a bed to a  chair (including a wheelchair)?: A Little Help needed standing up from a chair using your arms (e.g., wheelchair or bedside chair)?: A Little Help needed to walk in hospital room?: A Little Help needed climbing 3-5 steps with a railing? : A Little 6 Click Score: 18    End of Session Equipment Utilized During Treatment: Gait belt Activity Tolerance: Patient tolerated treatment well Patient left: in chair;with call bell/phone within reach;with chair alarm set;with nursing/sitter in room;with family/visitor present Nurse Communication: Mobility status;Precautions;Other (comment) PT Visit Diagnosis: Other abnormalities of gait and mobility (R26.89);Muscle weakness (generalized) (M62.81);History of falling (Z91.81);Repeated falls (R29.6);Pain     Time: [id number] PT Time Calculation (min) (ACUTE ONLY): 24 min  Charges:  $Gait Training: 8-22 mins $Therapeutic Activity: 8-22 mins                     Julaine Fusi PTA 05/12/21, 2:11 PM

## 2021-05-12 NOTE — Progress Notes (Signed)
OT Cancellation Note  Patient Details Name: Eddie Cowan MRN: [id number] DOB: Dec 21, 1955   Cancelled Treatment:    Reason Eval/Treat Not Completed: Pain limiting ability to participate. Pt endorsing chest pain this morning, work up pending. Will re-attempt OT tx at later date/time as appropriate pending work up.   Ardeth Perfect., MPH, MS, OTR/L ascom [phone] 05/12/21, 8:43 AM

## 2021-05-12 NOTE — Discharge Summary (Signed)
Physician Discharge Summary  Patient ID: Eddie Cowan MRN: [id number] DOB/AGE: 1955-06-23 66 y.o.  Admit date: 05/08/2021 Discharge date: 05/13/2021  Admission Diagnoses: Lumbar spondylolisthesis and pseudoarthrosis  Discharge Diagnoses:  Principal Problem:   Lumbar radiculopathy Pseudoarthrosis Sacral Fracture   Discharged Condition: good  Hospital Course:  Eddie Cowan is a 66 y.o s/p L2-S2 arthrodesis and posterior fusion.  L2-3 and L5-S1 decompression.  His intraoperative course was uncomplicated and he was admitted for pain control, drain output monitoring, and physical therapy.  He was seen by physical therapy and deemed appropriate for discharge home with home therapy.  On postop day 3 he developed shortness of breath and left-sided chest pain with deep inspiration.  Cardiac work-up and work-up for PE were negative.  He did struggle with constipation during his mission however was able to have a bowel movement prior to discharge.  The remainder of his hospital course was uncomplicated.  Consults: None  Significant Diagnostic Studies:  CT PE- negative for PE Troponin x2 WNL EKG- without signs of acute MI  Treatments: surgery: as above. Please see separately dictated operative report for further details.  Discharge Exam: Blood pressure 114/75, pulse 78, temperature 98.2 F (36.8 C), resp. rate 16, height 5\' 8"  (1.727 m), weight 75.8 kg, SpO2 96 %. AAOx3 CNI 5/5 throughout SILT Incision c/d/i  Disposition: Discharge disposition: 06-Home-Health Care Svc       Discharge Instructions     Discharge patient   Complete by: As directed    Discharge disposition: 01-Home or Self Care   Discharge patient date: 05/13/2021   Incentive spirometry RT   Complete by: As directed    Remove dressing in 24 hours   Complete by: As directed       Allergies as of 05/13/2021       Reactions   Shellfish Allergy Anaphylaxis        Medication List     STOP taking these  medications    apixaban 5 MG Tabs tablet Commonly known as: ELIQUIS   traMADol 50 MG tablet Commonly known as: ULTRAM       TAKE these medications    acetaminophen 500 MG tablet Commonly known as: TYLENOL Take 1,000 mg by mouth every 8 (eight) hours as needed for moderate pain.   aspirin EC 81 MG tablet Take 81 mg by mouth daily. Swallow whole.   citalopram 20 MG tablet Commonly known as: CELEXA Take 20 mg by mouth every morning.   donepezil 10 MG tablet Commonly known as: ARICEPT Take 10 mg by mouth at bedtime.   Echinacea 500 MG Caps Take 500 mg by mouth daily.   Fish Oil 1000 MG Caps Take 1,000 mg by mouth daily. With Garlic   gabapentin [id number] MG capsule Commonly known as: NEURONTIN Take 300 mg by mouth 4 (four) times daily.   hydrochlorothiazide 25 MG tablet Commonly known as: HYDRODIURIL Take 25 mg by mouth every morning.   MELATONIN PO Take 1 tablet by mouth as needed.   methocarbamol 500 MG tablet Commonly known as: ROBAXIN Take 1 tablet (500 mg total) by mouth every 6 (six) hours as needed for muscle spasms. What changed:  when to take this reasons to take this   multivitamin with minerals tablet Take 1 tablet by mouth daily.   Oxycodone HCl 10 MG Tabs Take 1 tablet (10 mg total) by mouth every 4 (four) hours as needed for up to 5 days for severe pain ((score 7 to 10)). What changed:  medication  strength how much to take when to take this reasons to take this   pantoprazole 40 MG tablet Commonly known as: PROTONIX Take 40 mg by mouth every evening.   pravastatin 20 MG tablet Commonly known as: PRAVACHOL Take 20 mg by mouth every evening.   senna 8.6 MG Tabs tablet Commonly known as: SENOKOT Take 1 tablet (8.6 mg total) by mouth 2 (two) times daily.   sildenafil 100 MG tablet Commonly known as: VIAGRA Take 100 mg by mouth daily as needed for erectile dysfunction.   zolpidem 12.5 MG CR tablet Commonly known as: AMBIEN CR Take 12.5  mg by mouth at bedtime.         SignedMeade Maw 05/13/2021, 8:39 AM

## 2021-05-12 NOTE — Progress Notes (Signed)
PT Cancellation Note  Patient Details Name: Eddie Cowan MRN: [id number] DOB: [birth year]   Cancelled Treatment:     PT attempt. Per RN, "pt is complaining of some chest pain." EKG ordered. Will return later if medically cleared and appropriate to participate.    Rushie Chestnut 05/12/2021, 8:03 AM

## 2021-05-12 NOTE — Plan of Care (Signed)
PLAN OF CARE ONGOING  Problem: Education: Goal: Ability to verbalize activity precautions or restrictions will improve 05/12/2021 0629 by Kaydon Husby, Laurena Slimmer, RN Outcome: Progressing 05/12/2021 0628 by Elimelech Houseman, Laurena Slimmer, RN Outcome: Progressing Goal: Knowledge of the prescribed therapeutic regimen will improve 05/12/2021 0629 by Fredrico Beedle, Laurena Slimmer, RN Outcome: Progressing 05/12/2021 0628 by Rosaelena Kemnitz, Laurena Slimmer, RN Outcome: Progressing Goal: Understanding of discharge needs will improve 05/12/2021 0629 by Lourdes Manning, Laurena Slimmer, RN Outcome: Progressing 05/12/2021 0628 by Ellanora Rayborn, Laurena Slimmer, RN Outcome: Progressing   Problem: Activity: Goal: Ability to avoid complications of mobility impairment will improve 05/12/2021 0629 by Helon Wisinski, Laurena Slimmer, RN Outcome: Progressing 05/12/2021 0628 by Azayla Polo, Laurena Slimmer, RN Outcome: Progressing Goal: Ability to tolerate increased activity will improve 05/12/2021 0629 by Zalmen Wrightsman, Laurena Slimmer, RN Outcome: Progressing 05/12/2021 0628 by Eufelia Veno, Laurena Slimmer, RN Outcome: Progressing Goal: Will remain free from falls 05/12/2021 0629 by Saga Balthazar, Laurena Slimmer, RN Outcome: Progressing 05/12/2021 0628 by Amere Iott, Laurena Slimmer, RN Outcome: Progressing   Problem: Bowel/Gastric: Goal: Gastrointestinal status for postoperative course will improve 05/12/2021 0629 by Britiany Silbernagel, Laurena Slimmer, RN Outcome: Progressing 05/12/2021 0628 by Jatavian Calica, Laurena Slimmer, RN Outcome: Progressing   Problem: Clinical Measurements: Goal: Ability to maintain clinical measurements within normal limits will improve 05/12/2021 0629 by Janira Mandell, Laurena Slimmer, RN Outcome: Progressing 05/12/2021 0628 by Shallyn Constancio, Laurena Slimmer, RN Outcome: Progressing Goal: Postoperative complications will be avoided or minimized 05/12/2021 0629 by Aberdeen Hafen, Laurena Slimmer, RN Outcome: Progressing 05/12/2021 0628 by Kathrynn Backstrom, Laurena Slimmer, RN Outcome: Progressing Goal: Diagnostic test results will improve 05/12/2021 0629 by Faten Frieson, Laurena Slimmer,  RN Outcome: Progressing 05/12/2021 0628 by Norell Brisbin, Laurena Slimmer, RN Outcome: Progressing   Problem: Pain Management: Goal: Pain level will decrease 05/12/2021 0629 by Fabrizio Filip, Laurena Slimmer, RN Outcome: Progressing 05/12/2021 0628 by Sera Hitsman, Laurena Slimmer, RN Outcome: Progressing   Problem: Skin Integrity: Goal: Will show signs of wound healing 05/12/2021 0629 by Daegan Arizmendi, Laurena Slimmer, RN Outcome: Progressing 05/12/2021 0628 by Ysidra Sopher, Laurena Slimmer, RN Outcome: Progressing   Problem: Health Behavior/Discharge Planning: Goal: Identification of resources available to assist in meeting health care needs will improve 05/12/2021 0629 by Malikai Gut, Laurena Slimmer, RN Outcome: Progressing 05/12/2021 0628 by Esmeralda Blanford, Laurena Slimmer, RN Outcome: Progressing   Problem: Bladder/Genitourinary: Goal: Urinary functional status for postoperative course will improve 05/12/2021 0629 by Colby Catanese, Laurena Slimmer, RN Outcome: Progressing 05/12/2021 0628 by Lilian Fuhs, Laurena Slimmer, RN Outcome: Progressing

## 2021-05-13 LAB — AEROBIC/ANAEROBIC CULTURE W GRAM STAIN (SURGICAL/DEEP WOUND)
Culture: NO GROWTH
Gram Stain: NONE SEEN

## 2021-05-13 MED ORDER — SENNA 8.6 MG PO TABS: 1.0000 | ORAL_TABLET | Freq: Two times a day (BID) | ORAL | 0 refills | Status: DC

## 2021-05-13 MED ORDER — METHOCARBAMOL 500 MG PO TABS: 500.0000 mg | ORAL_TABLET | Freq: Four times a day (QID) | ORAL | 0 refills | Status: DC | PRN

## 2021-05-13 MED ORDER — OXYCODONE HCL 10 MG PO TABS: 10.0000 mg | ORAL_TABLET | ORAL | 0 refills | Status: AC | PRN

## 2021-05-13 NOTE — Progress Notes (Signed)
° °   Attending Progress Note  History: Eddie Cowan is s/p L2-S2 PSF with L2-3 and L5-S1 decompression.   POD5: BM this AM.  Doing well otherwise.  POD4: Constipation and chest pain overnight.  Cardiac work-up negative thus far. POD3: reports improved pain overnight  POD2: some headache and back pain overnight. Controlled with medications POD1: NAEO  Physical Exam: Vitals:   05/13/21 0428 05/13/21 0731  BP: 134/73 114/75  Pulse: 83 78  Resp: 18 16  Temp: 98.7 F (37.1 C) 98.2 F (36.8 C)  SpO2: 98% 96%    AA Ox3 CNI  Strength:5/5 throughout BLE  Incision c/d/i  Data:  Recent Labs  Lab 05/12/21 0637  NA 130*  K 3.9  CL 95*  CO2 30  BUN 10  CREATININE 0.73  GLUCOSE 121*  CALCIUM 8.3*   No results for input(s): AST, ALT, ALKPHOS in the last 168 hours.  Invalid input(s): TBILI   Recent Labs  Lab 05/09/21 0634  WBC 11.6*  HGB 10.1*  HCT 31.4*  PLT 140*   No results for input(s): APTT, INR in the last 168 hours.       Other tests/results: cultures NTD.  Troponins negative x2  Assessment/Plan:  Eddie Cowan is a 66 y.o s/p L2-S2 PSF.   - mobilize - pain control - DVT prophylaxis - PTOT - dispo planning underway  Venetia Night MD Department of Neurosurgery

## 2021-05-13 NOTE — Progress Notes (Signed)
Physical Therapy Treatment Patient Details Name: Eddie Cowan MRN: [id number] DOB: 07-Jan-1956 Today's Date: 05/13/2021   History of Present Illness Pt is a 66 y.o. male with c/o difficulty with walking, changes in his posture, and R leg pain; has S1 fx.  H/o L3-5 PSF August 2021.  05/08/21 pt s/p ORIF sacral fx, posterolateral arthrodesis L2 to S2, posterior segmental instrumentation L2 to S2, and lumbar decompression L2-3 and L5-S1.  PMH includes MS, numbness/tingling L>R hands, c-spine surgery, B femur repair, R shoulder dislocation repair R 2008, sleep apnea with CPAP, htn, h/o DVT, anxiety, migraines, htn, and shingles.    PT Comments    Pt is A and O x 4. Agreeable to session and motivated throughout. Pt demonstrated all necessary abilities to safely Dc home with HHPT to follow. Cleared from an acute PT standpoint to safely DC home.     Recommendations for follow up therapy are one component of a multi-disciplinary discharge planning process, led by the attending physician.  Recommendations may be updated based on patient status, additional functional criteria and insurance authorization.  Follow Up Recommendations  Home health PT     Assistance Recommended at Discharge Frequent or constant Supervision/Assistance  Patient can return home with the following A little help with walking and/or transfers;A little help with bathing/dressing/bathroom;Assistance with cooking/housework;Assist for transportation;Help with stairs or ramp for entrance   Equipment Recommendations  None recommended by PT       Precautions / Restrictions Precautions Precautions: Fall;Back Precaution Booklet Issued: Yes (comment) Required Braces or Orthoses: Spinal Brace Spinal Brace: Lumbar corset Restrictions Weight Bearing Restrictions: No     Mobility  Bed Mobility Overal bed mobility: Needs Assistance Bed Mobility: Rolling, Sidelying to Sit, Supine to Sit Rolling: Min assist Sidelying to sit:  Supervision       General bed mobility comments: Increased time required to exit bed.    Transfers Overall transfer level: Needs assistance Equipment used: Rolling walker (2 wheels) Transfers: Sit to/from Stand Sit to Stand: Supervision      General transfer comment: no physical assistance to exit bed. increased time required    Ambulation/Gait Ambulation/Gait assistance: Supervision Gait Distance (Feet): 200 Feet Assistive device: Rolling walker (2 wheels) Gait Pattern/deviations: Step-through pattern Gait velocity: decreased     General Gait Details: Pt was able to safely ambulate 200 ft. with RW without LOB or safety concern    Balance Overall balance assessment: Needs assistance Sitting-balance support: Feet supported, Bilateral upper extremity supported Sitting balance-Leahy Scale: Good     Standing balance support: During functional activity, Bilateral upper extremity supported, Reliant on assistive device for balance Standing balance-Leahy Scale: Good        Cognition Arousal/Alertness: Awake/alert Behavior During Therapy: WFL for tasks assessed/performed Overall Cognitive Status: Within Functional Limits for tasks assessed        General Comments: Pt was is A and O x 4               Pertinent Vitals/Pain Pain Assessment Pain Assessment: 0-10 Pain Score: 6  Pain Descriptors / Indicators: Discomfort Pain Intervention(s): Limited activity within patient's tolerance, Monitored during session, Premedicated before session, Repositioned, Ice applied     PT Goals (current goals can now be found in the care plan section) Acute Rehab PT Goals Patient Stated Goal: go home Progress towards PT goals: Progressing toward goals    Frequency    BID      PT Plan Current plan remains appropriate  AM-PAC PT "6 Clicks" Mobility   Outcome Measure  Help needed turning from your back to your side while in a flat bed without using bedrails?: A  Little Help needed moving from lying on your back to sitting on the side of a flat bed without using bedrails?: A Little Help needed moving to and from a bed to a chair (including a wheelchair)?: A Little Help needed standing up from a chair using your arms (e.g., wheelchair or bedside chair)?: A Little Help needed to walk in hospital room?: A Little Help needed climbing 3-5 steps with a railing? : A Little 6 Click Score: 18    End of Session Equipment Utilized During Treatment: Gait belt Activity Tolerance: Patient tolerated treatment well Patient left: in chair;with call bell/phone within reach;with chair alarm set;with nursing/sitter in room;with family/visitor present Nurse Communication: Mobility status;Precautions;Other (comment) PT Visit Diagnosis: Other abnormalities of gait and mobility (R26.89);Muscle weakness (generalized) (M62.81);History of falling (Z91.81);Repeated falls (R29.6);Pain     Time: 1025-1050 PT Time Calculation (min) (ACUTE ONLY): 25 min  Charges:  $Gait Training: 23-37 mins                     Julaine Fusi PTA 05/13/21, 12:49 PM

## 2021-05-13 NOTE — Plan of Care (Signed)
  Problem: Bowel/Gastric: Goal: Gastrointestinal status for postoperative course will improve Outcome: Not Progressing   

## 2021-05-13 NOTE — Plan of Care (Signed)
°  Problem: Education: Goal: Ability to verbalize activity precautions or restrictions will improve Outcome: Adequate for Discharge Goal: Knowledge of the prescribed therapeutic regimen will improve Outcome: Adequate for Discharge Goal: Understanding of discharge needs will improve Outcome: Adequate for Discharge   Problem: Activity: Goal: Ability to avoid complications of mobility impairment will improve Outcome: Adequate for Discharge Goal: Ability to tolerate increased activity will improve Outcome: Adequate for Discharge Goal: Will remain free from falls Outcome: Adequate for Discharge   Problem: Bowel/Gastric: Goal: Gastrointestinal status for postoperative course will improve Outcome: Adequate for Discharge   Problem: Pain Management: Goal: Pain level will decrease Outcome: Adequate for Discharge   Problem: Health Behavior/Discharge Planning: Goal: Identification of resources available to assist in meeting health care needs will improve Outcome: Adequate for Discharge   Problem: Bladder/Genitourinary: Goal: Urinary functional status for postoperative course will improve Outcome: Adequate for Discharge

## 2021-05-13 NOTE — Progress Notes (Signed)
Patient has received an enema and a laxative, no bowel movement this shift.

## 2022-01-16 ENCOUNTER — Ambulatory Visit
Admission: RE | Admit: 2022-01-16 | Discharge: 2022-01-16 | Disposition: A | Discharge status: Home / Self Care | Payer: Self-pay | Source: Ambulatory Visit | Attending: Neurosurgery | Admitting: Neurosurgery

## 2022-01-16 ENCOUNTER — Other Ambulatory Visit: Payer: Self-pay

## 2022-01-16 DIAGNOSIS — Z049 Encounter for examination and observation for unspecified reason: Secondary | ICD-10-CM

## 2022-02-08 ENCOUNTER — Other Ambulatory Visit: Payer: Self-pay

## 2022-02-08 DIAGNOSIS — Z981 Arthrodesis status: Secondary | ICD-10-CM

## 2022-02-13 ENCOUNTER — Ambulatory Visit (INDEPENDENT_AMBULATORY_CARE_PROVIDER_SITE_OTHER): Payer: Medicare Other | Admitting: Neurosurgery

## 2022-02-13 ENCOUNTER — Encounter: Payer: Self-pay | Admitting: Neurosurgery

## 2022-02-13 ENCOUNTER — Ambulatory Visit
Admission: RE | Admit: 2022-02-13 | Discharge: 2022-02-13 | Disposition: A | Discharge status: Home / Self Care | Payer: Medicare Other | Source: Ambulatory Visit | Attending: Neurosurgery | Admitting: Neurosurgery

## 2022-02-13 ENCOUNTER — Ambulatory Visit
Admission: RE | Admit: 2022-02-13 | Discharge: 2022-02-13 | Disposition: A | Discharge status: Home / Self Care | Payer: Medicare Other | Attending: Neurosurgery | Admitting: Neurosurgery

## 2022-02-13 VITALS — BP 124/74 | Ht 68.0 in | Wt 167.0 lb

## 2022-02-13 DIAGNOSIS — Z981 Arthrodesis status: Secondary | ICD-10-CM | POA: Insufficient documentation

## 2022-02-13 DIAGNOSIS — M4317 Spondylolisthesis, lumbosacral region: Secondary | ICD-10-CM | POA: Diagnosis not present

## 2022-02-13 DIAGNOSIS — S32028D Other fracture of second lumbar vertebra, subsequent encounter for fracture with routine healing: Secondary | ICD-10-CM

## 2022-02-13 DIAGNOSIS — S3210XD Unspecified fracture of sacrum, subsequent encounter for fracture with routine healing: Secondary | ICD-10-CM | POA: Diagnosis not present

## 2022-02-13 DIAGNOSIS — M438X9 Other specified deforming dorsopathies, site unspecified: Secondary | ICD-10-CM

## 2022-02-13 DIAGNOSIS — Z09 Encounter for follow-up examination after completed treatment for conditions other than malignant neoplasm: Secondary | ICD-10-CM

## 2022-02-13 NOTE — Progress Notes (Signed)
   DOS: L2-S2 PSF, L2-3 decompression, L5-S1 decompression 05/08/21   HISTORY OF PRESENT ILLNESS: 02/13/2022 [name] is status post lumbar fusion.  He is doing extremely well at  PHYSICAL EXAMINATION:   Vitals:   02/13/22 1447  BP: 124/74   General: Patient is well developed, well nourished, calm, collected, and in no apparent distress.  NEUROLOGICAL:  General: In no acute distress.  Awake, alert, oriented to person, place, and time. Pupils equal round and reactive to light.   Strength:  Side Iliopsoas Quads Hamstring PF DF EHL  R 5 5 5 5 5 5   L 5 5 5 5 5 5    Incision c/d/i   ROS (Neurologic): Negative except as noted above  IMAGING: No complications noted  ASSESSMENT/PLAN:  [name] is doing well after lumbar fusion.  I am very pleased with his response to surgery.  His imaging is stable.  He is off all restrictions.  I will see him back on an as-needed basis.  I spent a total of 10 minutes in face-to-face and non-face-to-face activities related to this patient's care today.   MD, [facility] Department of Neurosurgery

## 2022-05-16 ENCOUNTER — Telehealth: Payer: Self-pay

## 2022-05-16 DIAGNOSIS — Z981 Arthrodesis status: Secondary | ICD-10-CM

## 2022-05-16 NOTE — Telephone Encounter (Signed)
-----   Message from Peggyann Shoals sent at 05/16/2022 10:13 AM EST ----- Regarding: order Contact: [phone] L2-S2 posterior spine fusion, L2-3 decompression,L5-S1 decompression on 05/08/2021 He would like an order for an accessibility lift, it's like an elevator installed in his home. He was told that if he gets a signed note from the doctor he can save on the tax fee. He is paying for this out of pocket but any money he can save would help. You can mail it to him when it's done. He said to tell Dr.Yarbroug hello from [facility].

## 2022-05-17 NOTE — Telephone Encounter (Signed)
Patient is aware letter has been mailed.

## 2022-05-17 NOTE — Telephone Encounter (Signed)
Order placed
